# Patient Record
Sex: Female | Born: 1980 | Race: White | Hispanic: No | Marital: Single | Smoking: Never smoker
Health system: Southern US, Community
[De-identification: ages and names within clinical notes are randomized; demographics above are authoritative.]

## PROBLEM LIST (undated history)

## (undated) DIAGNOSIS — F329 Major depressive disorder, single episode, unspecified: Secondary | ICD-10-CM

## (undated) DIAGNOSIS — F32A Depression, unspecified: Secondary | ICD-10-CM

## (undated) DIAGNOSIS — F419 Anxiety disorder, unspecified: Secondary | ICD-10-CM

## (undated) DIAGNOSIS — F319 Bipolar disorder, unspecified: Secondary | ICD-10-CM

## (undated) DIAGNOSIS — F1010 Alcohol abuse, uncomplicated: Secondary | ICD-10-CM

## (undated) HISTORY — PX: ADENOIDECTOMY: SUR15

---
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
## 2008-06-06 ENCOUNTER — Emergency Department (HOSPITAL_BASED_OUTPATIENT_CLINIC_OR_DEPARTMENT_OTHER): Admission: EM | Admit: 2008-06-06 | Discharge: 2008-06-06 | Payer: Self-pay | Admitting: Emergency Medicine

## 2010-03-10 ENCOUNTER — Ambulatory Visit (HOSPITAL_COMMUNITY)
Admission: RE | Admit: 2010-03-10 | Discharge: 2010-03-10 | Payer: Self-pay | Source: Home / Self Care | Admitting: Psychiatry

## 2011-02-11 ENCOUNTER — Ambulatory Visit: Payer: 59 | Admitting: Family Medicine

## 2011-03-29 DIAGNOSIS — J309 Allergic rhinitis, unspecified: Secondary | ICD-10-CM | POA: Insufficient documentation

## 2011-05-19 DIAGNOSIS — G4700 Insomnia, unspecified: Secondary | ICD-10-CM | POA: Insufficient documentation

## 2012-02-23 DIAGNOSIS — Z800 Family history of malignant neoplasm of digestive organs: Secondary | ICD-10-CM | POA: Insufficient documentation

## 2014-02-13 DIAGNOSIS — F1021 Alcohol dependence, in remission: Secondary | ICD-10-CM | POA: Insufficient documentation

## 2014-02-13 DIAGNOSIS — Z8249 Family history of ischemic heart disease and other diseases of the circulatory system: Secondary | ICD-10-CM | POA: Insufficient documentation

## 2014-03-06 DIAGNOSIS — F3132 Bipolar disorder, current episode depressed, moderate: Secondary | ICD-10-CM | POA: Insufficient documentation

## 2014-12-05 DIAGNOSIS — F3281 Premenstrual dysphoric disorder: Secondary | ICD-10-CM | POA: Insufficient documentation

## 2014-12-05 DIAGNOSIS — Z87891 Personal history of nicotine dependence: Secondary | ICD-10-CM | POA: Insufficient documentation

## 2018-08-15 ENCOUNTER — Other Ambulatory Visit: Payer: Self-pay | Admitting: Obstetrics and Gynecology

## 2018-08-15 ENCOUNTER — Other Ambulatory Visit (HOSPITAL_COMMUNITY)
Admission: RE | Admit: 2018-08-15 | Discharge: 2018-08-15 | Disposition: A | Discharge status: Home / Self Care | Payer: BLUE CROSS/BLUE SHIELD | Source: Ambulatory Visit | Attending: Obstetrics and Gynecology | Admitting: Obstetrics and Gynecology

## 2018-08-15 DIAGNOSIS — Z124 Encounter for screening for malignant neoplasm of cervix: Secondary | ICD-10-CM | POA: Insufficient documentation

## 2018-09-05 ENCOUNTER — Other Ambulatory Visit: Payer: Self-pay | Admitting: Obstetrics and Gynecology

## 2018-09-06 LAB — CYTOLOGY - PAP
Diagnosis: NEGATIVE
HPV: NOT DETECTED

## 2018-09-12 ENCOUNTER — Encounter (HOSPITAL_COMMUNITY): Payer: Self-pay | Admitting: Emergency Medicine

## 2018-09-12 ENCOUNTER — Emergency Department (HOSPITAL_COMMUNITY)
Admission: EM | Admit: 2018-09-12 | Discharge: 2018-09-12 | Disposition: A | Discharge status: Home / Self Care | Payer: BLUE CROSS/BLUE SHIELD | Attending: Emergency Medicine | Admitting: Emergency Medicine

## 2018-09-12 ENCOUNTER — Other Ambulatory Visit: Payer: Self-pay

## 2018-09-12 DIAGNOSIS — F419 Anxiety disorder, unspecified: Secondary | ICD-10-CM | POA: Diagnosis not present

## 2018-09-12 DIAGNOSIS — R202 Paresthesia of skin: Secondary | ICD-10-CM | POA: Diagnosis present

## 2018-09-12 DIAGNOSIS — M62838 Other muscle spasm: Secondary | ICD-10-CM | POA: Diagnosis not present

## 2018-09-12 DIAGNOSIS — M5412 Radiculopathy, cervical region: Secondary | ICD-10-CM | POA: Insufficient documentation

## 2018-09-12 HISTORY — DX: Bipolar disorder, unspecified: F31.9

## 2018-09-12 HISTORY — DX: Anxiety disorder, unspecified: F41.9

## 2018-09-12 HISTORY — DX: Alcohol abuse, uncomplicated: F10.10

## 2018-09-12 LAB — COMPREHENSIVE METABOLIC PANEL
ALT: 13 U/L (ref 0–44)
AST: 18 U/L (ref 15–41)
Albumin: 4.6 g/dL (ref 3.5–5.0)
Alkaline Phosphatase: 35 U/L — ABNORMAL LOW (ref 38–126)
Anion gap: 8 (ref 5–15)
BUN: 9 mg/dL (ref 6–20)
CO2: 23 mmol/L (ref 22–32)
Calcium: 9.2 mg/dL (ref 8.9–10.3)
Chloride: 105 mmol/L (ref 98–111)
Creatinine, Ser: 1.03 mg/dL — ABNORMAL HIGH (ref 0.44–1.00)
GFR calc Af Amer: >60 mL/min (ref >60)
GFR calc non Af Amer: >60 mL/min (ref >60)
Glucose, Bld: 95 mg/dL (ref 70–99)
Potassium: 3.5 mmol/L (ref 3.5–5.1)
Sodium: 136 mmol/L (ref 135–145)
Total Bilirubin: 0.8 mg/dL (ref 0.3–1.2)
Total Protein: 7.2 g/dL (ref 6.5–8.1)

## 2018-09-12 LAB — CBC WITH DIFFERENTIAL/PLATELET
Abs Immature Granulocytes: 0.02 10*3/uL (ref 0.00–0.07)
Basophils Absolute: 0 10*3/uL (ref 0.0–0.1)
Basophils Relative: 1 %
Eosinophils Absolute: 0.2 10*3/uL (ref 0.0–0.5)
Eosinophils Relative: 3 %
HCT: 37.2 % (ref 36.0–46.0)
Hemoglobin: 12.2 g/dL (ref 12.0–15.0)
Immature Granulocytes: 0 %
Lymphocytes Relative: 36 %
Lymphs Abs: 2.1 10*3/uL (ref 0.7–4.0)
MCH: 28.8 pg (ref 26.0–34.0)
MCHC: 32.8 g/dL (ref 30.0–36.0)
MCV: 87.9 fL (ref 80.0–100.0)
Monocytes Absolute: 0.4 10*3/uL (ref 0.1–1.0)
Monocytes Relative: 8 %
Neutro Abs: 3.1 10*3/uL (ref 1.7–7.7)
Neutrophils Relative %: 52 %
Platelets: 256 10*3/uL (ref 150–400)
RBC: 4.23 MIL/uL (ref 3.87–5.11)
RDW: 15.2 % (ref 11.5–15.5)
WBC: 5.9 10*3/uL (ref 4.0–10.5)
nRBC: 0 % (ref 0.0–0.2)

## 2018-09-12 LAB — LIPASE, BLOOD: Lipase: 24 U/L (ref 11–51)

## 2018-09-12 LAB — TROPONIN I: Troponin I: <0.03 ng/mL (ref <0.03)

## 2018-09-12 MED ORDER — METHOCARBAMOL 500 MG PO TABS: 1000.0000 mg | ORAL_TABLET | Freq: Three times a day (TID) | ORAL | 0 refills | Status: DC | PRN

## 2018-09-12 MED ORDER — DIAZEPAM 2 MG PO TABS
2.0000 mg | ORAL_TABLET | Freq: Once | ORAL | Status: AC
  Administered 2018-09-12: 21:00:00 2 mg via ORAL
  Filled 2018-09-12: qty 1

## 2018-09-12 MED ORDER — DIAZEPAM 5 MG PO TABS: 5.0000 mg | ORAL_TABLET | Freq: Two times a day (BID) | ORAL | 0 refills | Status: DC | PRN

## 2018-09-12 MED ORDER — METHOCARBAMOL 500 MG PO TABS
1000.0000 mg | ORAL_TABLET | Freq: Once | ORAL | Status: AC
  Administered 2018-09-12: 21:00:00 1000 mg via ORAL
  Filled 2018-09-12: qty 2

## 2018-09-12 MED ORDER — KETOROLAC TROMETHAMINE 60 MG/2ML IM SOLN
60.0000 mg | Freq: Once | INTRAMUSCULAR | Status: AC
  Administered 2018-09-12: 21:00:00 60 mg via INTRAMUSCULAR
  Filled 2018-09-12: qty 2

## 2018-09-12 MED ORDER — IBUPROFEN 600 MG PO TABS: 600.0000 mg | ORAL_TABLET | Freq: Four times a day (QID) | ORAL | 0 refills | Status: DC | PRN

## 2018-09-12 NOTE — ED Provider Notes (Signed)
[facility]-EMERGENCY DEPT Provider Note   CSN: [id number] Arrival date & time: 09/12/18  2007    History   Chief Complaint Chief Complaint  Patient presents with  . Tingling    HPI Renee Roach is a 38 y.o. female.     HPI Patient with history of bipolar 1 disorder and anxiety presents with right-sided neck pain that radiates down her right arm.  Associated with tingling sensation to her right hand.  States she noticed this this morning when she woke up.  No focal weakness.  No known trauma.  No fever or chills. Past Medical History:  Diagnosis Date  . Alcohol abuse   . Anxiety   . Bipolar 1 disorder (HCC)     There are no active problems to display for this patient.   History reviewed. No pertinent surgical history.   OB History   No obstetric history on file.      Home Medications    Prior to Admission medications   Medication Sig Start Date End Date Taking? Authorizing Provider  diazepam (VALIUM) 5 MG tablet Take 1 tablet (5 mg total) by mouth every 12 (twelve) hours as needed for muscle spasms. 09/12/18   Loren RacerYelverton, Kaeden Mester, MD  ibuprofen (ADVIL) 600 MG tablet Take 1 tablet (600 mg total) by mouth every 6 (six) hours as needed. 09/12/18   Loren RacerYelverton, Ansley Mangiapane, MD  methocarbamol (ROBAXIN) 500 MG tablet Take 2 tablets (1,000 mg total) by mouth every 8 (eight) hours as needed for muscle spasms. 09/12/18   Loren RacerYelverton, Arabia Nylund, MD    Family History No family history on file.  Social History Social History   Tobacco Use  . Smoking status: Not on file  Substance Use Topics  . Alcohol use: Not on file  . Drug use: Not on file     Allergies   Sulfa antibiotics   Review of Systems Review of Systems  Constitutional: Negative for chills and fever.  HENT: Negative for sore throat and trouble swallowing.   Eyes: Positive for photophobia. Negative for visual disturbance.  Respiratory: Negative for cough and shortness of breath.    Cardiovascular: Negative for chest pain.  Gastrointestinal: Positive for nausea. Negative for abdominal pain, diarrhea and vomiting.  Musculoskeletal: Positive for back pain, myalgias and neck pain. Negative for neck stiffness.  Skin: Negative for rash and wound.  Neurological: Positive for numbness and headaches. Negative for weakness.  Psychiatric/Behavioral: The patient is nervous/anxious.   All other systems reviewed and are negative.    Physical Exam Updated Vital Signs BP 119/81 (BP Location: Left Arm)   Pulse 71   Temp 98.9 F (37.2 C) (Oral)   Resp 13   SpO2 99%   Physical Exam Vitals signs and nursing note reviewed.  Constitutional:      Appearance: Normal appearance. She is well-developed.     Comments: Very anxious appearing  HENT:     Head: Normocephalic and atraumatic.     Comments: Cranial nerves II through XII intact.    Nose: Nose normal.     Mouth/Throat:     Mouth: Mucous membranes are moist.  Eyes:     Extraocular Movements: Extraocular movements intact.     Pupils: Pupils are equal, round, and reactive to light.  Neck:     Musculoskeletal: Normal range of motion and neck supple. Muscular tenderness present. No neck rigidity.     Comments: No meningismus.  No cervical lymphadenopathy.  No midline cervical tenderness to palpation.  Patient does  have right-sided paracervical muscular tenderness as well as right trapezius tenderness and spasm. Cardiovascular:     Rate and Rhythm: Normal rate and regular rhythm.     Heart sounds: No murmur. No friction rub. No gallop.   Pulmonary:     Effort: Pulmonary effort is normal. No respiratory distress.     Breath sounds: Normal breath sounds. No stridor. No wheezing, rhonchi or rales.  Chest:     Chest wall: No tenderness.  Abdominal:     General: Bowel sounds are normal.     Palpations: Abdomen is soft.     Tenderness: There is no abdominal tenderness. There is no guarding or rebound.  Musculoskeletal: Normal  range of motion.        General: No swelling, tenderness, deformity or signs of injury.     Right lower leg: No edema.     Left lower leg: No edema.     Comments: Full range of motion of the right shoulder elbow and wrist without deformity, effusion or erythema.  2+ distal pulses all extremities.  No swelling noted.  No midline thoracic or lumbar tenderness.  No lower extremity swelling, asymmetry or tenderness.  Lymphadenopathy:     Cervical: No cervical adenopathy.  Skin:    General: Skin is warm and dry.     Findings: No erythema or rash.  Neurological:     General: No focal deficit present.     Mental Status: She is alert and oriented to person, place, and time.     Comments: 5/5 motor in all extremities.  Decreased sensation in the right hand compared to the left.  Otherwise sensation intact.  Psychiatric:        Behavior: Behavior normal.      ED Treatments / Results  Labs (all labs ordered are listed, but only abnormal results are displayed) Labs Reviewed  COMPREHENSIVE METABOLIC PANEL - Abnormal; Notable for the following components:      Result Value   Creatinine, Ser 1.03 (*)    Alkaline Phosphatase 35 (*)    All other components within normal limits  CBC WITH DIFFERENTIAL/PLATELET  TROPONIN I  LIPASE, BLOOD    EKG EKG Interpretation  Date/Time:  Wednesday Sep 12 2018 20:40:46 EDT Ventricular Rate:  74 PR Interval:    QRS Duration: 80 QT Interval:  380 QTC Calculation: 422 R Axis:   80 Text Interpretation:  Sinus rhythm Baseline wander in lead(s) V3 Confirmed by Loren Racer ([id number]) on 09/12/2018 9:06:02 PM   Radiology No results found.  Procedures Procedures (including critical care time)  Medications Ordered in ED Medications  ketorolac (TORADOL) injection 60 mg (60 mg Intramuscular Given 09/12/18 2115)  methocarbamol (ROBAXIN) tablet 1,000 mg (1,000 mg Oral Given 09/12/18 2114)  diazepam (VALIUM) tablet 2 mg (2 mg Oral Given 09/12/18 2114)      Initial Impression / Assessment and Plan / ED Course  I have reviewed the triage vital signs and the nursing notes.  Pertinent labs & imaging results that were available during my care of the patient were reviewed by me and considered in my medical decision making (see chart for details).        Patient presents with radicular type symptoms.  Also appears to be quite anxious which is complicating clinical picture.  Low suspicion for CAD or CVA.  Will treat symptomatically and screen with labs. Patient states she is feeling better after meds.  Vital signs remained stable.  Laboratory work within normal limits.  Return  precautions given. Final Clinical Impressions(s) / ED Diagnoses   Final diagnoses:  Cervical radiculopathy  Trapezius muscle spasm  Anxiety    ED Discharge Orders         Ordered    ibuprofen (ADVIL) 600 MG tablet  Every 6 hours PRN     09/12/18 2231    methocarbamol (ROBAXIN) 500 MG tablet  Every 8 hours PRN     09/12/18 2231    diazepam (VALIUM) 5 MG tablet  Every 12 hours PRN     09/12/18 2231           Loren Racer, MD 09/12/18 2235

## 2018-09-12 NOTE — ED Triage Notes (Signed)
Patient reports pain from right neck to right hand. States tinging in right hand and feeling "confused." Denies N/V/D, cough, fever. A&Ox4. Also reports central chest pain today but denies at this time. Ambulatory.

## 2018-09-12 NOTE — ED Notes (Signed)
Pt verbalized discharge instructions and follow up care. Alert and ambulatory. No iv.  

## 2018-10-24 ENCOUNTER — Other Ambulatory Visit: Payer: Self-pay

## 2018-10-24 ENCOUNTER — Encounter (HOSPITAL_BASED_OUTPATIENT_CLINIC_OR_DEPARTMENT_OTHER): Payer: Self-pay | Admitting: *Deleted

## 2018-10-27 ENCOUNTER — Other Ambulatory Visit (HOSPITAL_COMMUNITY)
Admission: RE | Admit: 2018-10-27 | Discharge: 2018-10-27 | Disposition: A | Discharge status: Home / Self Care | Payer: BC Managed Care – PPO | Source: Ambulatory Visit | Attending: Obstetrics and Gynecology | Admitting: Obstetrics and Gynecology

## 2018-10-27 DIAGNOSIS — Z1159 Encounter for screening for other viral diseases: Secondary | ICD-10-CM | POA: Diagnosis not present

## 2018-10-27 DIAGNOSIS — Z01812 Encounter for preprocedural laboratory examination: Secondary | ICD-10-CM | POA: Insufficient documentation

## 2018-10-27 LAB — SARS CORONAVIRUS 2 (TAT 6-24 HRS): SARS Coronavirus 2: NEGATIVE

## 2018-10-29 ENCOUNTER — Encounter (HOSPITAL_BASED_OUTPATIENT_CLINIC_OR_DEPARTMENT_OTHER)
Admission: RE | Admit: 2018-10-29 | Discharge: 2018-10-29 | Disposition: A | Discharge status: Home / Self Care | Payer: BC Managed Care – PPO | Source: Ambulatory Visit | Attending: Obstetrics and Gynecology | Admitting: Obstetrics and Gynecology

## 2018-10-29 ENCOUNTER — Other Ambulatory Visit: Payer: Self-pay

## 2018-10-29 DIAGNOSIS — Z01812 Encounter for preprocedural laboratory examination: Secondary | ICD-10-CM | POA: Diagnosis present

## 2018-10-29 DIAGNOSIS — N9489 Other specified conditions associated with female genital organs and menstrual cycle: Secondary | ICD-10-CM | POA: Diagnosis not present

## 2018-10-29 LAB — CBC
HCT: 39.9 % (ref 36.0–46.0)
Hemoglobin: 12.8 g/dL (ref 12.0–15.0)
MCH: 28.8 pg (ref 26.0–34.0)
MCHC: 32.1 g/dL (ref 30.0–36.0)
MCV: 89.7 fL (ref 80.0–100.0)
Platelets: 263 10*3/uL (ref 150–400)
RBC: 4.45 MIL/uL (ref 3.87–5.11)
RDW: 15.1 % (ref 11.5–15.5)
WBC: 7.5 10*3/uL (ref 4.0–10.5)
nRBC: 0 % (ref 0.0–0.2)

## 2018-10-29 LAB — TYPE AND SCREEN
ABO/RH(D): O POS
Antibody Screen: NEGATIVE

## 2018-10-29 LAB — POCT PREGNANCY, URINE: Preg Test, Ur: NEGATIVE

## 2018-10-29 NOTE — Progress Notes (Signed)
Ensure pre surgery drink given with instructions to complete by 0830 dos, pt verbalized understanding. 

## 2018-10-30 LAB — ABO/RH: ABO/RH(D): O POS

## 2018-10-31 ENCOUNTER — Encounter (HOSPITAL_BASED_OUTPATIENT_CLINIC_OR_DEPARTMENT_OTHER): Payer: Self-pay | Admitting: Certified Registered Nurse Anesthetist

## 2018-10-31 ENCOUNTER — Ambulatory Visit (HOSPITAL_BASED_OUTPATIENT_CLINIC_OR_DEPARTMENT_OTHER): Payer: BC Managed Care – PPO | Admitting: Certified Registered Nurse Anesthetist

## 2018-10-31 ENCOUNTER — Ambulatory Visit (HOSPITAL_BASED_OUTPATIENT_CLINIC_OR_DEPARTMENT_OTHER)
Admission: RE | Admit: 2018-10-31 | Discharge: 2018-10-31 | Disposition: A | Discharge status: Home / Self Care | Payer: BC Managed Care – PPO | Attending: Obstetrics and Gynecology | Admitting: Obstetrics and Gynecology

## 2018-10-31 ENCOUNTER — Other Ambulatory Visit: Payer: Self-pay

## 2018-10-31 ENCOUNTER — Encounter (HOSPITAL_BASED_OUTPATIENT_CLINIC_OR_DEPARTMENT_OTHER)
Admission: RE | Disposition: A | Discharge status: Home / Self Care | Payer: Self-pay | Source: Home / Self Care | Attending: Obstetrics and Gynecology

## 2018-10-31 DIAGNOSIS — F3281 Premenstrual dysphoric disorder: Secondary | ICD-10-CM | POA: Diagnosis not present

## 2018-10-31 DIAGNOSIS — N840 Polyp of corpus uteri: Secondary | ICD-10-CM | POA: Insufficient documentation

## 2018-10-31 DIAGNOSIS — F419 Anxiety disorder, unspecified: Secondary | ICD-10-CM | POA: Insufficient documentation

## 2018-10-31 DIAGNOSIS — Z79899 Other long term (current) drug therapy: Secondary | ICD-10-CM | POA: Insufficient documentation

## 2018-10-31 DIAGNOSIS — N920 Excessive and frequent menstruation with regular cycle: Secondary | ICD-10-CM | POA: Diagnosis not present

## 2018-10-31 HISTORY — DX: Depression, unspecified: F32.A

## 2018-10-31 HISTORY — PX: DILATATION & CURETTAGE/HYSTEROSCOPY WITH MYOSURE: SHX6511

## 2018-10-31 SURGERY — DILATATION & CURETTAGE/HYSTEROSCOPY WITH MYOSURE
Anesthesia: General | Site: Vagina

## 2018-10-31 MED ORDER — SILVER NITRATE-POT NITRATE 75-25 % EX MISC
CUTANEOUS | Status: AC
  Filled 2018-10-31: qty 1

## 2018-10-31 MED ORDER — LIDOCAINE 2% (20 MG/ML) 5 ML SYRINGE
INTRAMUSCULAR | Status: AC
  Filled 2018-10-31: qty 5

## 2018-10-31 MED ORDER — MIDAZOLAM HCL 5 MG/5ML IJ SOLN
INTRAMUSCULAR | Status: DC | PRN
  Administered 2018-10-31: 2 mg via INTRAVENOUS

## 2018-10-31 MED ORDER — FENTANYL CITRATE (PF) 100 MCG/2ML IJ SOLN
25.0000 ug | INTRAMUSCULAR | Status: DC | PRN
  Administered 2018-10-31: 25 ug via INTRAVENOUS
  Administered 2018-10-31: 50 ug via INTRAVENOUS

## 2018-10-31 MED ORDER — MIDAZOLAM HCL 2 MG/2ML IJ SOLN: 1.0000 mg | INTRAMUSCULAR | Status: DC | PRN

## 2018-10-31 MED ORDER — ONDANSETRON HCL 4 MG/2ML IJ SOLN: 4.0000 mg | Freq: Once | INTRAMUSCULAR | Status: DC | PRN

## 2018-10-31 MED ORDER — FENTANYL CITRATE (PF) 100 MCG/2ML IJ SOLN
INTRAMUSCULAR | Status: AC
  Filled 2018-10-31: qty 2

## 2018-10-31 MED ORDER — MIDAZOLAM HCL 2 MG/2ML IJ SOLN
INTRAMUSCULAR | Status: AC
  Filled 2018-10-31: qty 2

## 2018-10-31 MED ORDER — LACTATED RINGERS IV SOLN
INTRAVENOUS | Status: DC
  Administered 2018-10-31: 12:00:00 via INTRAVENOUS

## 2018-10-31 MED ORDER — SCOPOLAMINE 1 MG/3DAYS TD PT72: 1.0000 | MEDICATED_PATCH | Freq: Once | TRANSDERMAL | Status: DC

## 2018-10-31 MED ORDER — VASOPRESSIN 20 UNIT/ML IV SOLN
INTRAVENOUS | Status: AC
  Filled 2018-10-31: qty 1

## 2018-10-31 MED ORDER — LIDOCAINE 2% (20 MG/ML) 5 ML SYRINGE
INTRAMUSCULAR | Status: DC | PRN
  Administered 2018-10-31: 60 mg via INTRAVENOUS

## 2018-10-31 MED ORDER — ONDANSETRON HCL 4 MG/2ML IJ SOLN
INTRAMUSCULAR | Status: DC | PRN
  Administered 2018-10-31: 4 mg via INTRAVENOUS

## 2018-10-31 MED ORDER — LIDOCAINE HCL 2 % IJ SOLN
INTRAMUSCULAR | Status: AC
  Filled 2018-10-31: qty 20

## 2018-10-31 MED ORDER — OXYCODONE HCL 5 MG/5ML PO SOLN: 5.0000 mg | Freq: Once | ORAL | Status: DC | PRN

## 2018-10-31 MED ORDER — PHENYLEPHRINE 40 MCG/ML (10ML) SYRINGE FOR IV PUSH (FOR BLOOD PRESSURE SUPPORT)
PREFILLED_SYRINGE | INTRAVENOUS | Status: AC
  Filled 2018-10-31: qty 10

## 2018-10-31 MED ORDER — DEXAMETHASONE SODIUM PHOSPHATE 10 MG/ML IJ SOLN
INTRAMUSCULAR | Status: AC
  Filled 2018-10-31: qty 1

## 2018-10-31 MED ORDER — LIDOCAINE HCL 2 % IJ SOLN
INTRAMUSCULAR | Status: DC | PRN
  Administered 2018-10-31: 10 mL

## 2018-10-31 MED ORDER — PHENYLEPHRINE 40 MCG/ML (10ML) SYRINGE FOR IV PUSH (FOR BLOOD PRESSURE SUPPORT)
PREFILLED_SYRINGE | INTRAVENOUS | Status: DC | PRN
  Administered 2018-10-31 (×2): 80 ug via INTRAVENOUS

## 2018-10-31 MED ORDER — DEXMEDETOMIDINE HCL IN NACL 200 MCG/50ML IV SOLN
INTRAVENOUS | Status: DC | PRN
  Administered 2018-10-31 (×2): 8 ug via INTRAVENOUS

## 2018-10-31 MED ORDER — IBUPROFEN 600 MG PO TABS: 600.0000 mg | ORAL_TABLET | Freq: Four times a day (QID) | ORAL | 0 refills | Status: DC | PRN

## 2018-10-31 MED ORDER — PROPOFOL 10 MG/ML IV BOLUS
INTRAVENOUS | Status: DC | PRN
  Administered 2018-10-31: 150 mg via INTRAVENOUS

## 2018-10-31 MED ORDER — OXYCODONE HCL 5 MG PO TABS: 5.0000 mg | ORAL_TABLET | Freq: Once | ORAL | Status: DC | PRN

## 2018-10-31 MED ORDER — DEXAMETHASONE SODIUM PHOSPHATE 10 MG/ML IJ SOLN
INTRAMUSCULAR | Status: DC | PRN
  Administered 2018-10-31: 10 mg via INTRAVENOUS

## 2018-10-31 MED ORDER — ONDANSETRON HCL 4 MG/2ML IJ SOLN
INTRAMUSCULAR | Status: AC
  Filled 2018-10-31: qty 2

## 2018-10-31 MED ORDER — FENTANYL CITRATE (PF) 100 MCG/2ML IJ SOLN: 50.0000 ug | INTRAMUSCULAR | Status: DC | PRN

## 2018-10-31 MED ORDER — FENTANYL CITRATE (PF) 100 MCG/2ML IJ SOLN
INTRAMUSCULAR | Status: DC | PRN
  Administered 2018-10-31 (×4): 25 ug via INTRAVENOUS
  Administered 2018-10-31: 100 ug via INTRAVENOUS

## 2018-10-31 SURGICAL SUPPLY — 26 items
BRIEF STRETCH FOR OB PAD XXL (UNDERPADS AND DIAPERS) ×3 IMPLANT
CANISTER SUCT 3000ML PPV (MISCELLANEOUS) ×3 IMPLANT
CATH ROBINSON RED A/P 14FR (CATHETERS) ×3 IMPLANT
CLOTH BEACON ORANGE TIMEOUT ST (SAFETY) ×3 IMPLANT
DEVICE MYOSURE LITE (MISCELLANEOUS) IMPLANT
DEVICE MYOSURE REACH (MISCELLANEOUS) ×3 IMPLANT
DILATOR CANAL MILEX (MISCELLANEOUS) ×3 IMPLANT
GLOVE BIO SURGEON STRL SZ7 (GLOVE) ×3 IMPLANT
GLOVE BIOGEL PI IND STRL 7.0 (GLOVE) ×3 IMPLANT
GLOVE BIOGEL PI INDICATOR 7.0 (GLOVE) ×6
GLOVE SURG SS PI 7.0 STRL IVOR (GLOVE) ×3 IMPLANT
GLOVE SURG SYN 7.5  E (GLOVE) ×2
GLOVE SURG SYN 7.5 E (GLOVE) ×1 IMPLANT
GOWN STRL REUS W/TWL LRG LVL3 (GOWN DISPOSABLE) ×6 IMPLANT
IV NS IRRIG 3000ML ARTHROMATIC (IV SOLUTION) ×3 IMPLANT
KIT PROCEDURE FLUENT (KITS) ×3 IMPLANT
MYOSURE XL FIBROID (MISCELLANEOUS)
PACK VAGINAL MINOR WOMEN LF (CUSTOM PROCEDURE TRAY) ×3 IMPLANT
PAD OB MATERNITY 4.3X12.25 (PERSONAL CARE ITEMS) ×3 IMPLANT
PAD PREP 24X48 CUFFED NSTRL (MISCELLANEOUS) ×3 IMPLANT
SEAL ROD LENS SCOPE MYOSURE (ABLATOR) ×3 IMPLANT
SLEEVE SCD COMPRESS KNEE MED (MISCELLANEOUS) ×3 IMPLANT
SURGILUBE 2OZ TUBE FLIPTOP (MISCELLANEOUS) IMPLANT
SYSTEM TISS REMOVAL MYOSURE XL (MISCELLANEOUS) IMPLANT
TOWEL GREEN STERILE FF (TOWEL DISPOSABLE) ×6 IMPLANT
WATER STERILE IRR 1000ML POUR (IV SOLUTION) IMPLANT

## 2018-10-31 NOTE — Brief Op Note (Signed)
10/31/2018  1:30 PM  PATIENT:  Renee Roach  38 y.o. female  PRE-OPERATIVE DIAGNOSIS: Menorrhagia,  N94.89 endometrial mass  POST-OPERATIVE DIAGNOSIS: Same, endometrial polyps  PROCEDURE:  Procedure(s): DILATATION & CURETTAGE/HYSTEROSCOPY WITH MYOSURE (N/A)  SURGEON:  Surgeon(s) and Role:    Thurnell Lose, MD - Primary  PHYSICIAN ASSISTANT:   ASSISTANTS: Technician   ANESTHESIA:   general and paracervical block  EBL:  5 mL   BLOOD ADMINISTERED:none  DRAINS: none   LOCAL MEDICATIONS USED:  LIDOCAINE  and Amount: 10 ml  SPECIMEN:  Source of Specimen:  endometrial polyps in sock, endometrial currettings  DISPOSITION OF SPECIMEN:  PATHOLOGY  COUNTS:  YES  TOURNIQUET:  * No tourniquets in log *  DICTATION: .Other Dictation: Dictation Number [id number]  PLAN OF CARE: Discharge to home after PACU  PATIENT DISPOSITION:  PACU - hemodynamically stable.   Delay start of Pharmacological VTE agent (>24hrs) due to surgical blood loss or risk of bleeding: yes

## 2018-10-31 NOTE — Interval H&P Note (Signed)
History and Physical Interval Note:  10/31/2018 12:23 PM  Renee Roach  has presented today for surgery, with the diagnosis of N94.89 endometrial mass.  The various methods of treatment have been discussed with the patient and family. After consideration of risks, benefits and other options for treatment, the patient has consented to  Procedure(s) with comments: DILATATION & CURETTAGE/HYSTEROSCOPY WITH MYOSURE (N/A) - myosure rep will be here.  Confirmed on 10/24/2018 CS as a surgical intervention.  The patient's history has been reviewed, patient examined, no change in status, stable for surgery.  I have reviewed the patient's chart and labs.  Questions were answered to the patient's satisfaction.     Thurnell Lose

## 2018-10-31 NOTE — Anesthesia Postprocedure Evaluation (Signed)
Anesthesia Post Note  Patient: Production designer, theatre/television/film  Procedure(s) Performed: DILATATION & CURETTAGE/HYSTEROSCOPY WITH MYOSURE (N/A Vagina )     Patient location during evaluation: PACU Anesthesia Type: General Level of consciousness: awake and alert Pain management: pain level controlled Vital Signs Assessment: post-procedure vital signs reviewed and stable Respiratory status: spontaneous breathing, nonlabored ventilation and respiratory function stable Cardiovascular status: blood pressure returned to baseline and stable Postop Assessment: no apparent nausea or vomiting Anesthetic complications: no    Last Vitals:  Vitals:   10/31/18 1430 10/31/18 1515  BP: 104/75 116/90  Pulse: 75 75  Resp: 17 16  Temp:  37.1 C  SpO2: 98% 100%    Last Pain:  Vitals:   10/31/18 1515  TempSrc:   PainSc: 3                  Lidia Collum

## 2018-10-31 NOTE — Transfer of Care (Signed)
Immediate Anesthesia Transfer of Care Note  Patient: Renee Roach  Procedure(s) Performed: DILATATION & CURETTAGE/HYSTEROSCOPY WITH MYOSURE (N/A Vagina )  Patient Location: PACU  Anesthesia Type:General  Level of Consciousness: drowsy and patient cooperative  Airway & Oxygen Therapy: Patient Spontanous Breathing and Patient connected to face mask oxygen  Post-op Assessment: Report given to RN and Post -op Vital signs reviewed and stable  Post vital signs: Reviewed and stable  Last Vitals:  Vitals Value Taken Time  BP 91/57 10/31/18 1323  Temp    Pulse 73 10/31/18 1327  Resp 14 10/31/18 1327  SpO2 100 % 10/31/18 1327  Vitals shown include unvalidated device data.  Last Pain:  Vitals:   10/31/18 1144  TempSrc: Oral  PainSc: 0-No pain      Patients Stated Pain Goal: 0 (38/10/17 5102)  Complications: No apparent anesthesia complications

## 2018-10-31 NOTE — Anesthesia Preprocedure Evaluation (Addendum)
Anesthesia Evaluation  Patient identified by MRN, date of birth, ID band Patient awake    Reviewed: Allergy & Precautions, NPO status , Patient's Chart, lab work & pertinent test results  History of Anesthesia Complications Negative for: history of anesthetic complications  Airway Mallampati: I  TM Distance: >3 FB Neck ROM: Full    Dental  (+) Teeth Intact   Pulmonary neg pulmonary ROS,    Pulmonary exam normal        Cardiovascular negative cardio ROS Normal cardiovascular exam     Neuro/Psych PSYCHIATRIC DISORDERS Anxiety Depression Bipolar Disorder negative neurological ROS     GI/Hepatic negative GI ROS, (+)     substance abuse  alcohol use,   Endo/Other  negative endocrine ROS  Renal/GU negative Renal ROS  negative genitourinary   Musculoskeletal negative musculoskeletal ROS (+)   Abdominal   Peds  Hematology negative hematology ROS (+)   Anesthesia Other Findings   Reproductive/Obstetrics                            Anesthesia Physical Anesthesia Plan  ASA: II  Anesthesia Plan: General   Post-op Pain Management:    Induction: Intravenous  PONV Risk Score and Plan: 3 and Ondansetron, Dexamethasone, Midazolam and Treatment may vary due to age or medical condition  Airway Management Planned: LMA  Additional Equipment: None  Intra-op Plan:   Post-operative Plan: Extubation in OR  Informed Consent: I have reviewed the patients History and Physical, chart, labs and discussed the procedure including the risks, benefits and alternatives for the proposed anesthesia with the patient or authorized representative who has indicated his/her understanding and acceptance.     Dental advisory given  Plan Discussed with:   Anesthesia Plan Comments:        Anesthesia Quick Evaluation

## 2018-10-31 NOTE — Op Note (Signed)
Renee Roach, DAUPHINEE MEDICAL RECORD [id number] ACCOUNT [id number] DATE OF BIRTH:11-Jun-1980 FACILITY: [facility] LOCATION: [facility] PHYSICIAN:Ski Polich Al Decant, MD  OPERATIVE REPORT  DATE OF PROCEDURE:  10/31/2018  PREOPERATIVE DIAGNOSES:  Menorrhagia and endometrial mass.  POSTOPERATIVE DIAGNOSES:  Menorrhagia and endometrial mass, and endometrial polyps.  PROCEDURE:  Hysteroscopy, dilatation and curettage with endometrial polypectomy via MyoSure.  SURGEON:  Thurnell Lose, MD  ASSISTANT:  Technician.  ANESTHESIA:  General and paracervical block.  ESTIMATED BLOOD LOSS:  5 mL.  DRAINS:  None.  LOCAL:  Lidocaine 2%, 10 mL.  SPECIMENS:  Endometrial polyps in sac and endometrial curettings.  DISPOSITION OF SPECIMEN:  To Pathology.  COUNTS CORRECT:  Times 3.  DISPOSITION:  To PACU hemodynamically stable.  COMPLICATIONS:  None.  FINDINGS:  Normal shape of the uterine cavity.  Polypoid endometrium and endometrial polyps visualized.  Normal cervix and vagina.  DESCRIPTION OF PROCEDURE:  The patient was identified in the holding area.  She was then taken to the operating room with IV running.  She was placed in the dorsal lithotomy position, underwent anesthesia without complication.  She was then prepped and  draped in a normal fashion.  SCDs were on her legs and operating.  A timeout was performed.  A Graves speculum was inserted into the vagina.  Cervix was visualized.  Anterior lip of the cervix was injected with 2% lidocaine.  Single tooth tenaculum was then applied.  Paracervical block was completed to complete 10 mL of lidocaine.  The cervix  was then dilated with an os finder and dilated up to a 15 Pratt.  I attempted to advance the hysteroscope at lower doses and it was unable to do so.  I attempted to advance the hysteroscope before I dilated up to a 40 Pratt but was unable to get through  the internal os.  Once I was in the endometrial cavity, the findings  above were noted.  The Reach MyoSure was then used and the polypoid and polyps were removed without issue.  The pressure was at 100.  The deficit was 65 mL.  The MyoSure and hysteroscope were then  removed.  I did a sharp curettage of all 4 quadrants until a gritty cry was noted in the uterine cavity.  There was a moderate amount of tissue returned.  A single-tooth tenaculum was removed.  There was no bleeding from the tenaculum site or the cervical os.  All instruments, sponge and needle counts were correct x3 and the patient tolerated the procedure well.    She went to the recovery room in stable condition.  AN/NUANCE  D:10/31/2018 T:10/31/2018 JOB:[id number]

## 2018-10-31 NOTE — H&P (Signed)
History of Present Illness  Isolation Precautions:          Respiratory Illness Screening             1. Is fever present / reported?  No           2. Are respiratory illness symptom(s) present / reported?  No           3. Are other symptom(s) present / reported?  No           5. Has there been reported travel to a High Risk respiratory illness region?  No           6. Has close* contact with person(s) known to have communicable illness been reported?  No           7. Did travel or close contact (if applicable) occur within 14 days of symptom onset?  No General:          38 y/o female presents for preoperative clearance for Hysteroscopy/ D&C and removal of endometrial mass via MyoSure         Denies fever/chills, chest pain, SOB, headaches, numbness/tingling. No h/o complication with anesthesia, bleeding disorders or blood clots         Pt reports fishy vaginal odor.    Current Medications  TakingLysteda 650 MG Tablet 2 tablets Orally Three times daily x up to day 5 with menses    Clonazepam 0.5 MG Tablet 1/2 - 1 tablet at bedtime by mouth as needed    Tranexamic Acid 650 MG Tablet TAKE 2 TABLETS THREE TIMES DAILY X UP TO DAY 5 WITH MENSES ORALLY 30 DAYS Oral    Fusion Plus - Capsule 1 capsule between meals Orally Once a day    Trazodone HCl 50 MG Tablet 1 tablet at bedtime as needed Orally Once a day    Lamotrigine 25 MG Tablet 2 tablets Orally Once a day    BuPROPion HCl ER (SR) 150 MG Tablet Extended Release 12 Hour 1 tablet Orally twice a day    Medication List reviewed and reconciled with the patient        Past Medical History       Bipolar.        Insomnia.        PMDD with anxiety.        Miscarriage (2002).        History of alcoholism.            Surgical History  T&A 1990  oral surgery       Family History  Father: alive, hypertension, cholesterol, MI (45), colon cancer  Mother: alive, hypertension  Paternal Grand Father: deceased  Paternal Grand  Mother: deceased  Maternal Grand Father: deceased, diagnosed with Breast cancer  Maternal Grand Mother: deceased, ovarian cancer, Ovarian cancer  Mother had lump removed.      Social History  General:         Alcohol: rare: beer.        Children: none.        Seat belt use: yes.        Tobacco use               cigarettes:  Former smoker             Quit in year  11/06/2013             Tobacco history last updated  10/24/2018  Vaping  No       Religion: Ep[employer].        DENTAL CARE: regular.        Marital Status: single.        Recreational drug use: yes, ocass marijuanna.        OCCUPATION: journalist.        Exercise: 1-2 times per week.        no Travel outside [place].     Gyn History  Sexual activity currently sexually active.   Periods : irregular.   LMP 10-08-2018.   Birth control condoms.   Last pap smear date 08-15-2018 Neg/HPV neg.   Last mammogram date N/A.   Abnormal pap smear no.   STD none.   Menarche 54.      OB History  abortion  2003.      Allergies  Sulfamethoxazole: nasusea/ Vomiting/ diarrhea/ headaches      Hospitalization/Major Diagnostic Procedure  Numbness in face 09/12/2018      Review of Systems  See scanned ROS form for details.    Vital Signs  Wt 116.8, Wt change -6.2 lb, Ht 60.75, BMI 22.25, Temp 97.4, Pulse sitting 97, BP sitting 105/70.    Physical Examination  Chaperone present:          Chaperone present  for pelvic exam, Renee Roach,Renee Roach 10/24/2018 04:20:09 PM > .   GENERAL:          Patient appears  alert and oriented.          General Appearance:  well-appearing, well-developed, no acute distress.          Speech:  clear.   LUNGS:          Auscultation:  no wheezing/rhonchi/rales. CTA bilaterally.   HEART:          Heart sounds:  normal. RRR. no murmur.   ABDOMEN:          General:  soft nontender, nondistended, no masses.   FEMALE GENITOURINARY:          Pelvic  Not examined.          Vagina:  pink/moist  mucosa , yellow , frothy discharge, , no lesions.          Vulva:  normal , no lesions , no skin discoloration.   EXTREMITIES:          General:  No edema or calf tenderness.       Pt aware of scribe services today.    Assessments    1. Encounter for other preprocedural examination - Z01.818 (Primary)  2. Endometrial mass - N94.89  3. Vaginal discharge - N89.8      Treatment  1. Encounter for other preprocedural examination   Notes: Pt counseled on R/Roach/A of Hysteroscopy/D&C, including but not limited to infection, bleeding and injury to organs in the abdomen. Pt informed she will have to have a pregnancy test with negative results prior to surgery. Pt instructed not to take Ibuprofen 5 days prior to procedure and no Aspirin 7 days prior to procedure. All questions answered. Posteoperative follow up 2 weeks after surgery.      2. Endometrial mass   Notes: Pt counseled on R/Roach/A of procedure, including but not limited to infection, bleeding and injury to organs in the abdomen. Pt informed she will have to have a pregnancy test with negative results prior to surgery. Pt instructed not to take Ibuprofen 5 days prior to procedure and no Aspirin 7 days prior  to procedure.      3. Vaginal discharge        [facility]: [facility]      Procedures  Scribe Documentation:          Attestation:  I personally scribed for Dr. Dion Roach on the date of this appointment. Electronically signed by scribe , Renee Roach, Renee Roach 10/24/2018 04:11:01 PM > .            Labs        Lab: [facility]   +yeast       WBCS Slightly increased A Normal -        CLUE CELLS None seen  None seen -        TRICHOMONAS None Seen  None Seen -        YEAST Rare A None seen -                 Renee Roach 10/25/2018 08:27:26 AM > Yeast present. Send in Diflucan 150 mg po once #1 Ref:1 Renee Roach,Renee Roach 10/25/2018 09:12:54 AM > spoke with pt-made her aware of results. Rx escribed.                Visit Codes  [id number] OV LEVEL 3.        Follow Up  2 Weeks (Reason: post op)    Care Plan Details

## 2018-10-31 NOTE — Anesthesia Procedure Notes (Signed)
Procedure Name: LMA Insertion Date/Time: 10/31/2018 12:39 PM Performed by: Maryella Shivers, CRNA Pre-anesthesia Checklist: Patient identified, Emergency Drugs available, Suction available and Patient being monitored Patient Re-evaluated:Patient Re-evaluated prior to induction Oxygen Delivery Method: Circle system utilized Preoxygenation: Pre-oxygenation with 100% oxygen Induction Type: IV induction Ventilation: Mask ventilation without difficulty LMA: LMA inserted LMA Size: 4.0 Number of attempts: 1 Airway Equipment and Method: Bite block Placement Confirmation: positive ETCO2 Tube secured with: Tape Dental Injury: Teeth and Oropharynx as per pre-operative assessment

## 2018-10-31 NOTE — Discharge Instructions (Addendum)
Dilation and Curettage or Vacuum Curettage, Care After °This sheet gives you information about how to care for yourself after your procedure. Your health care provider may also give you more specific instructions. If you have problems or questions, contact your health care provider. °What can I expect after the procedure? °After your procedure, it is common to have: °· Mild pain or cramping. °· Some vaginal bleeding or spotting. °These may last for up to 2 weeks after your procedure. °Follow these instructions at home: °Activity ° °· Do not drive or use heavy machinery while taking prescription pain medicine. °· Avoid driving for the first 24 hours after your procedure. °· Take frequent, short walks, followed by rest periods, throughout the day. Ask your health care provider what activities are safe for you. After 1-2 days, you may be able to return to your normal activities. °· Do not lift anything heavier than 10 lb (4.5 kg) until your health care provider approves. °· For at least 2 weeks, or as long as told by your health care provider, do not: °? Douche. °? Use tampons. °? Have sexual intercourse. °General instructions ° °· Take over-the-counter and prescription medicines only as told by your health care provider. This is especially important if you take blood thinning medicine. °· Do not take baths, swim, or use a hot tub until your health care provider approves. Take showers instead of baths. °· Wear compression stockings as told by your health care provider. These stockings help to prevent blood clots and reduce swelling in your legs. °· It is your responsibility to get the results of your procedure. Ask your health care provider, or the department performing the procedure, when your results will be ready. °· Keep all follow-up visits as told by your health care provider. This is important. °Contact a health care provider if: °· You have severe cramps that get worse or that do not get better with  medicine. °· You have severe abdominal pain. °· You cannot drink fluids without vomiting. °· You develop pain in a different area of your pelvis. °· You have bad-smelling vaginal discharge. °· You have a rash. °Get help right away if: °· You have vaginal bleeding that soaks more than one sanitary pad in 1 hour, for 2 hours in a row. °· You pass large blood clots from your vagina. °· You have a fever that is above 100.4°F (38.0°C). °· Your abdomen feels very tender or hard. °· You have chest pain. °· You have shortness of breath. °· You cough up blood. °· You feel dizzy or light-headed. °· You faint. °· You have pain in your neck or shoulder area. °This information is not intended to replace advice given to you by your health care provider. Make sure you discuss any questions you have with your health care provider. °Document Released: 04/01/2000 Document Revised: 03/17/2017 Document Reviewed: 11/05/2015 °Elsevier Patient Education © 2020 [employer] ° ° °Post Anesthesia Home Care Instructions ° °Activity: °Get plenty of rest for the remainder of the day. A responsible individual must stay with you for 24 hours following the procedure.  °For the next 24 hours, DO NOT: °-Drive a car °-Operate machinery °-Drink alcoholic beverages °-Take any medication unless instructed by your physician °-Make any legal decisions or sign important papers. ° °Meals: °Start with liquid foods such as gelatin or soup. Progress to regular foods as tolerated. Avoid greasy, spicy, heavy foods. If nausea and/or vomiting occur, drink only clear liquids until the nausea and/or vomiting   subsides. Call your physician if vomiting continues. ° °Special Instructions/Symptoms: °Your throat may feel dry or sore from the anesthesia or the breathing tube placed in your throat during surgery. If this causes discomfort, gargle with warm salt water. The discomfort should disappear within 24 hours. ° °If you had a scopolamine patch placed behind your ear  for the management of post- operative nausea and/or vomiting: ° °1. The medication in the patch is effective for 72 hours, after which it should be removed.  Wrap patch in a tissue and discard in the trash. Wash hands thoroughly with soap and water. °2. You may remove the patch earlier than 72 hours if you experience unpleasant side effects which may include dry mouth, dizziness or visual disturbances. °3. Avoid touching the patch. Wash your hands with soap and water after contact with the patch. °   ° ° °

## 2018-11-01 ENCOUNTER — Encounter (HOSPITAL_BASED_OUTPATIENT_CLINIC_OR_DEPARTMENT_OTHER): Payer: Self-pay | Admitting: Obstetrics and Gynecology

## 2019-10-14 ENCOUNTER — Other Ambulatory Visit: Payer: Self-pay | Admitting: Obstetrics and Gynecology

## 2019-10-14 DIAGNOSIS — Z1231 Encounter for screening mammogram for malignant neoplasm of breast: Secondary | ICD-10-CM

## 2019-10-25 ENCOUNTER — Other Ambulatory Visit: Payer: Self-pay

## 2019-10-25 ENCOUNTER — Ambulatory Visit
Admission: RE | Admit: 2019-10-25 | Discharge: 2019-10-25 | Disposition: A | Discharge status: Home / Self Care | Payer: BC Managed Care – PPO | Source: Ambulatory Visit | Attending: Obstetrics and Gynecology | Admitting: Obstetrics and Gynecology

## 2019-10-25 DIAGNOSIS — Z1231 Encounter for screening mammogram for malignant neoplasm of breast: Secondary | ICD-10-CM

## 2019-10-25 IMAGING — MG DIGITAL SCREENING BILAT W/ TOMO W/ CAD
6 of 10 series · 6 of 30 positions shown · non-contrast
Comparison: None.

CLINICAL DATA: Screening. This is the patient's initial baseline
mammogram.

EXAM:
DIGITAL SCREENING BILATERAL MAMMOGRAM WITH TOMO AND CAD

[L CC synth-2D (1 of 2)]
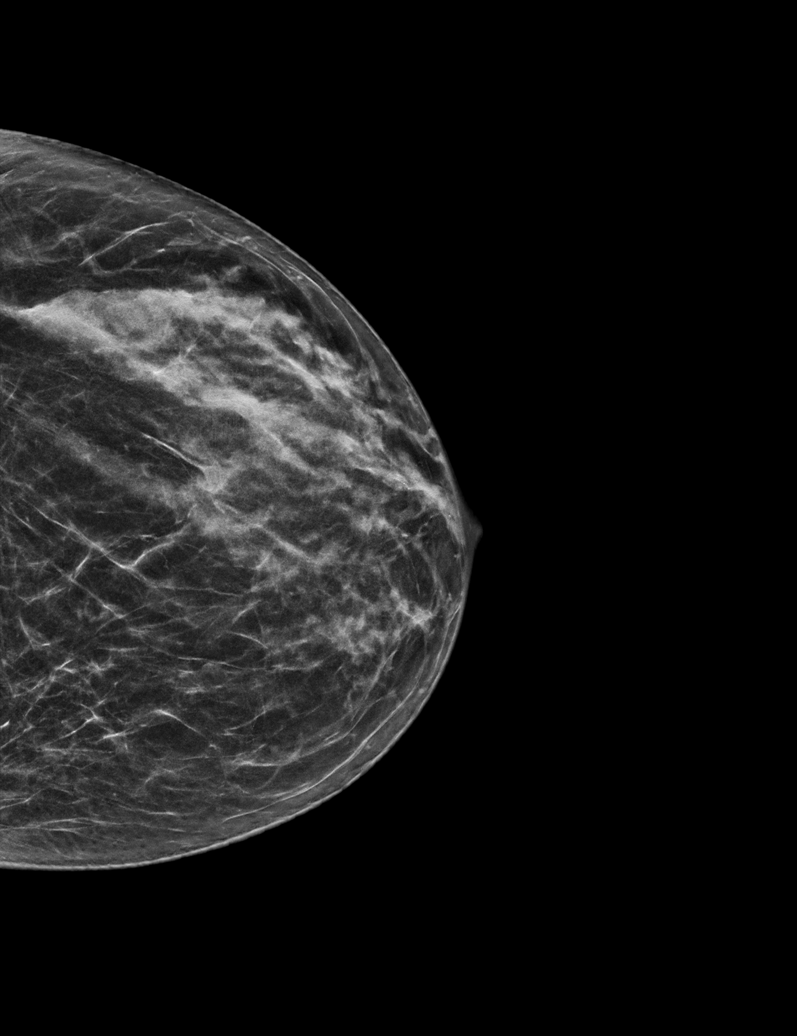

[L MLO synth-2D]
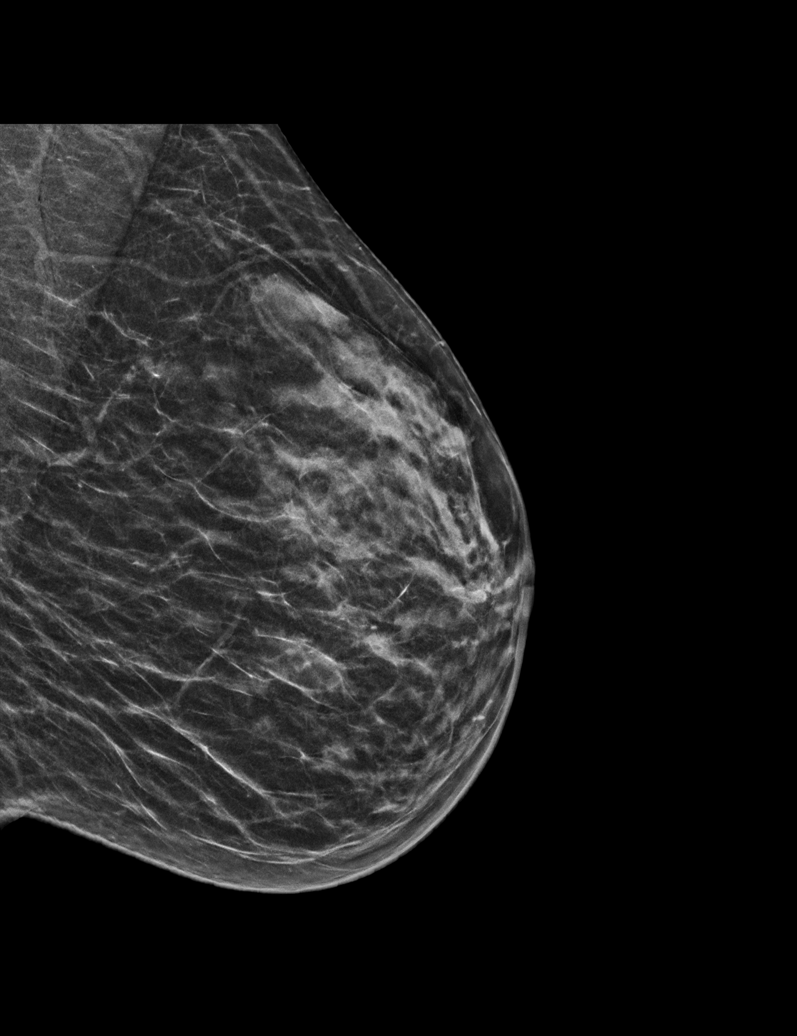

[R MLO synth-2D]
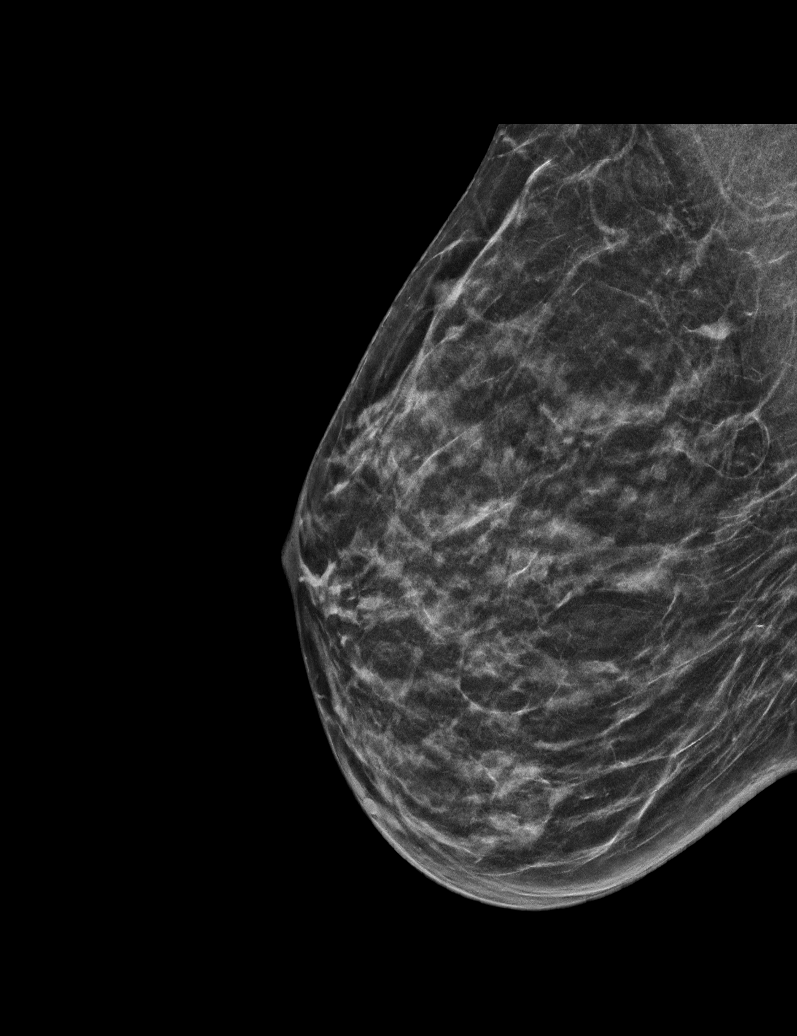

[R CC synth-2D]
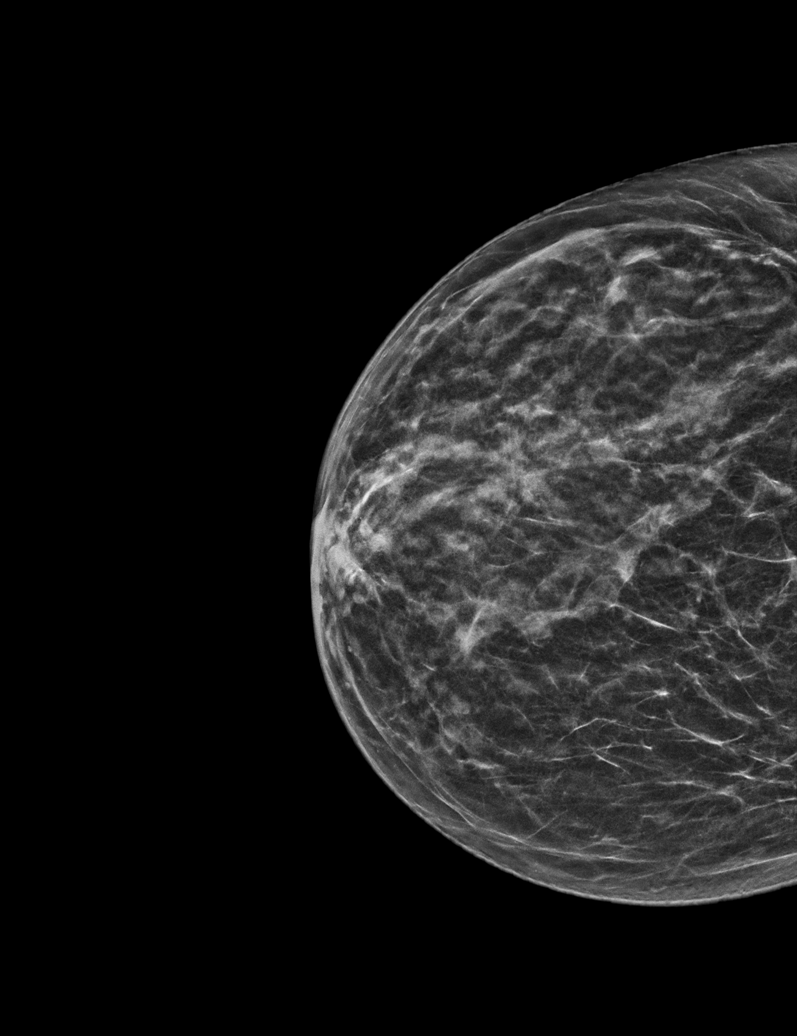

[L CC synth-2D (2 of 2)]
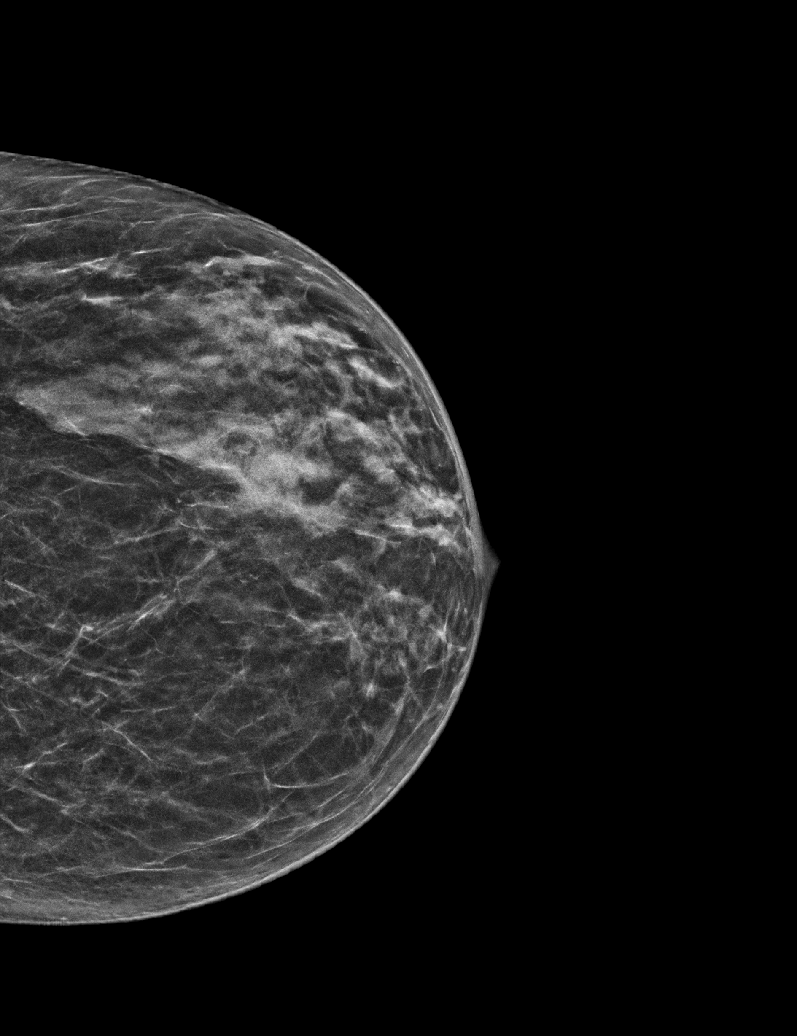

[L CC tomo · tomo slice 23/44.0]
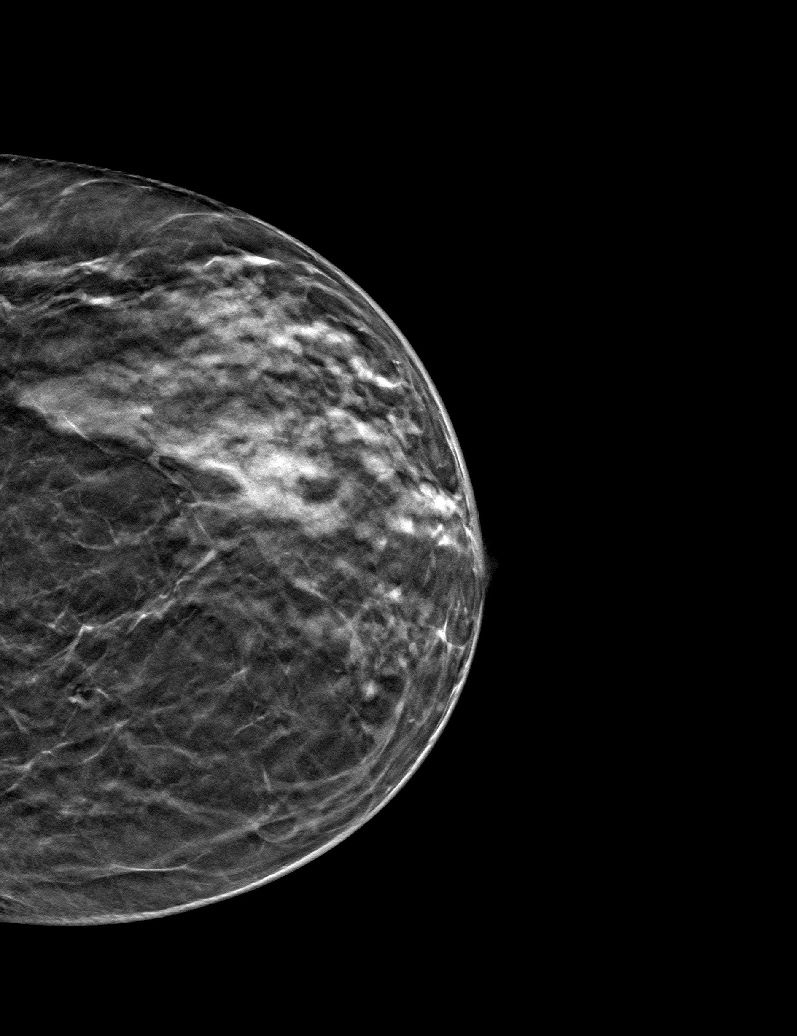

[6 of 30 positions shown; findings below may reference images not displayed]

ACR Breast Density Category c: The breast tissue is heterogeneously
dense, which may obscure small masses
FINDINGS: There are no findings suspicious for malignancy. Images were
processed with CAD.
IMPRESSION: No mammographic evidence of malignancy. A result letter of this
screening mammogram will be mailed directly to the patient.

RECOMMENDATION:
Screening mammogram in one year. (Code:[[place]])

BI-RADS CATEGORY  1: Negative.

## 2020-01-27 ENCOUNTER — Other Ambulatory Visit: Payer: Self-pay | Admitting: Family Medicine

## 2020-01-27 DIAGNOSIS — R200 Anesthesia of skin: Secondary | ICD-10-CM

## 2020-02-04 ENCOUNTER — Other Ambulatory Visit: Payer: Self-pay

## 2020-02-04 ENCOUNTER — Ambulatory Visit
Admission: RE | Admit: 2020-02-04 | Discharge: 2020-02-04 | Disposition: A | Discharge status: Home / Self Care | Payer: BC Managed Care – PPO | Source: Ambulatory Visit | Attending: Family Medicine | Admitting: Family Medicine

## 2020-02-04 DIAGNOSIS — R200 Anesthesia of skin: Secondary | ICD-10-CM

## 2020-02-04 IMAGING — MR MR CERVICAL SPINE W/O CM
4 of 5 series · 28 of 48 positions shown · non-contrast
Comparison: None.

CLINICAL DATA: Initial evaluation for neck pain with radiation
between scapula and down right arm. Numbness and tingling in hands
and face.

EXAM:
MRI CERVICAL SPINE WITHOUT CONTRAST
TECHNIQUE: Multiplanar, multisequence MR imaging of the cervical spine was
performed. No intravenous contrast was administered.

[Series 5: T2 · sagittal · 3.0mm · 0.66mm/px · 6 of 14 slices shown (1 of 2)]
[im 1/14]
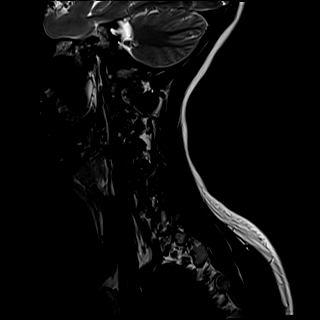
[im 3/14]
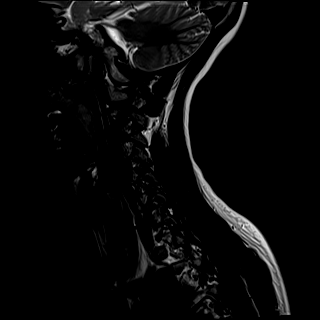
[im 6/14]
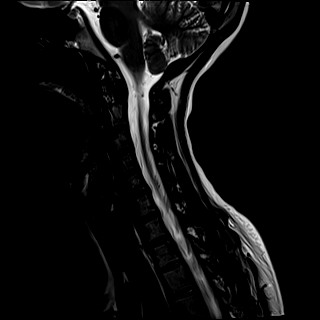
[im 8/14]
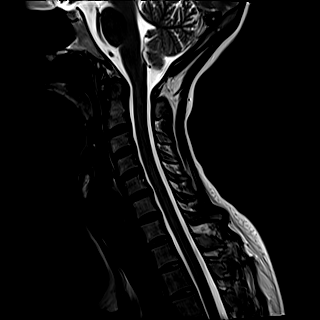
[im 11/14]
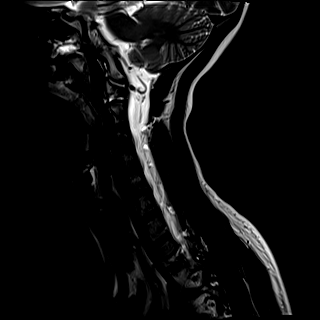
[im 14/14]
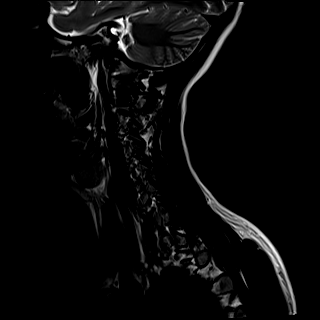

[Series 6: T1 · sagittal · 3.0mm · 0.66mm/px · 7 of 15 slices shown]
[im 1/15]
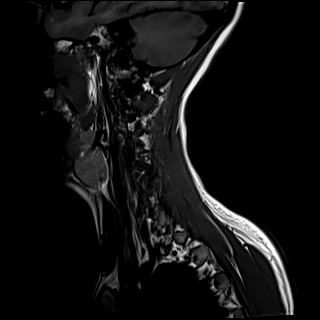
[im 3/15]
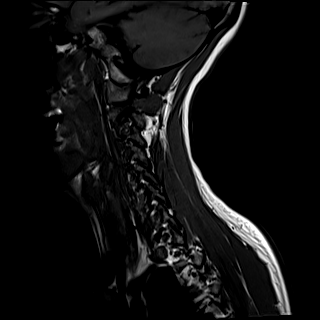
[im 5/15]
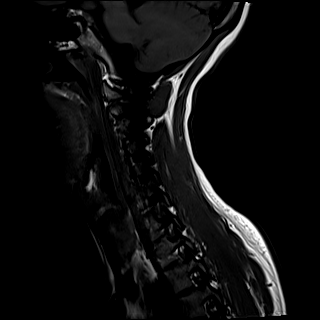
[im 8/15]
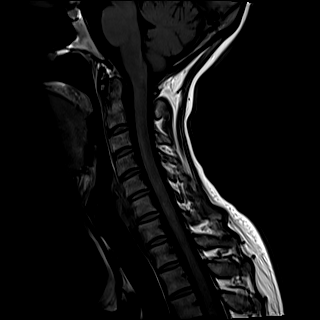
[im 10/15]
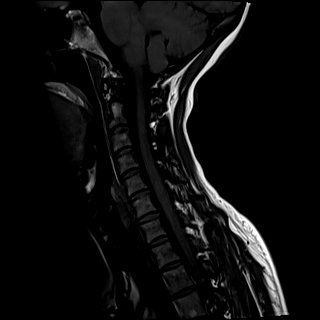
[im 12/15]
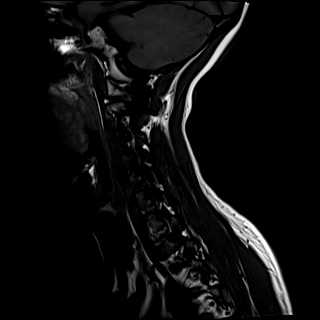
[im 15/15]
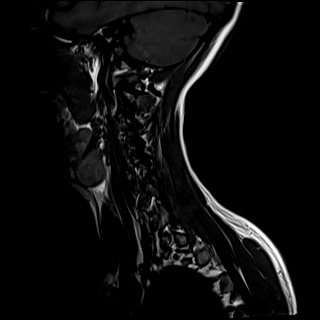

[Series 7: STIR · sagittal · 3.0mm · 0.33mm/px · 7 of 15 slices shown]
[im 1/15]
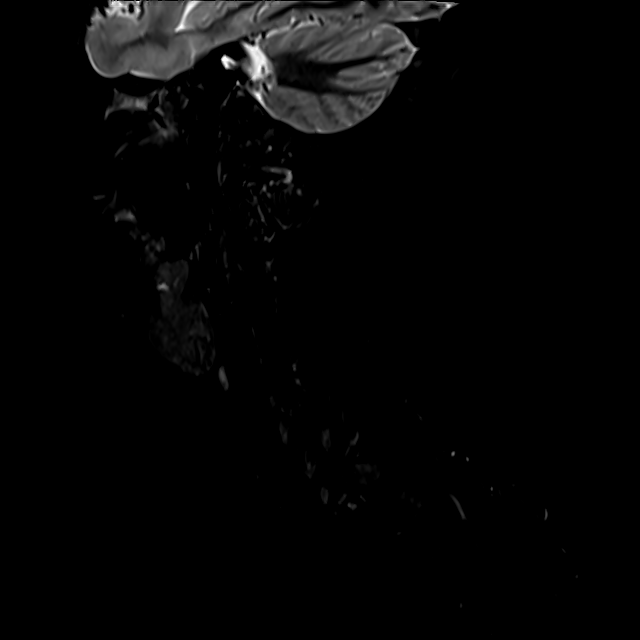
[im 3/15]
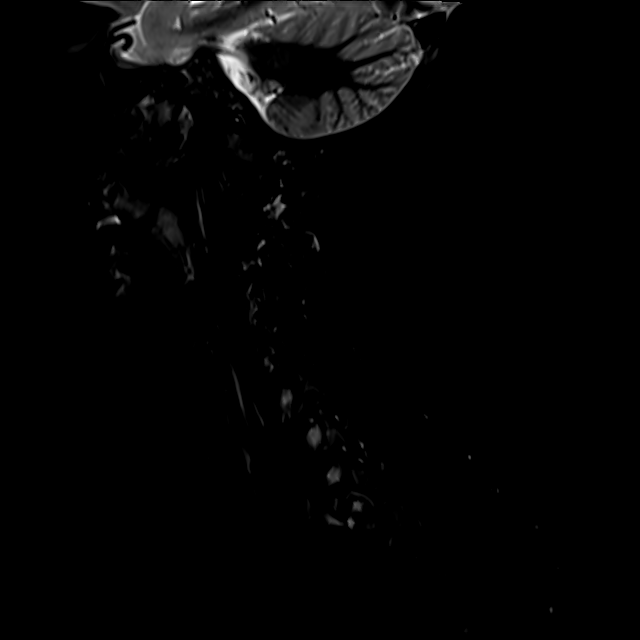
[im 5/15]
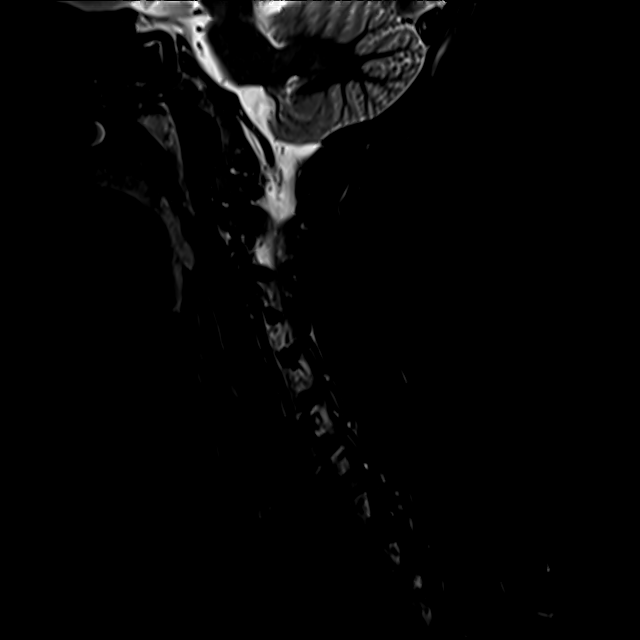
[im 8/15]
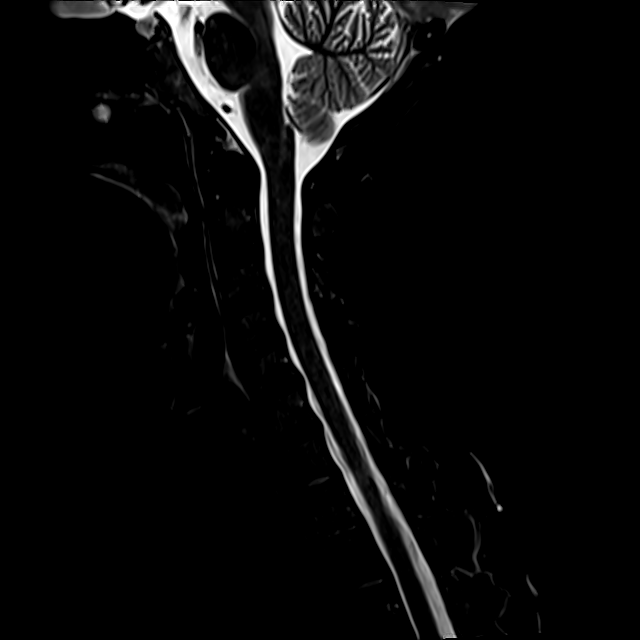
[im 10/15]
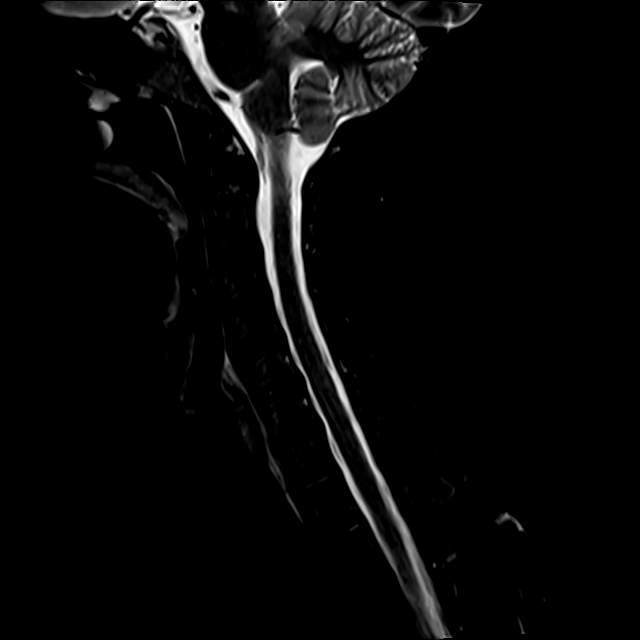
[im 12/15]
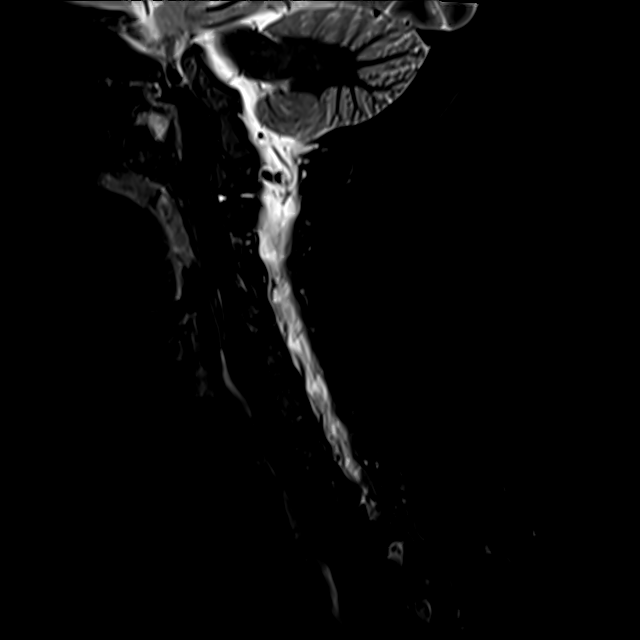
[im 15/15]
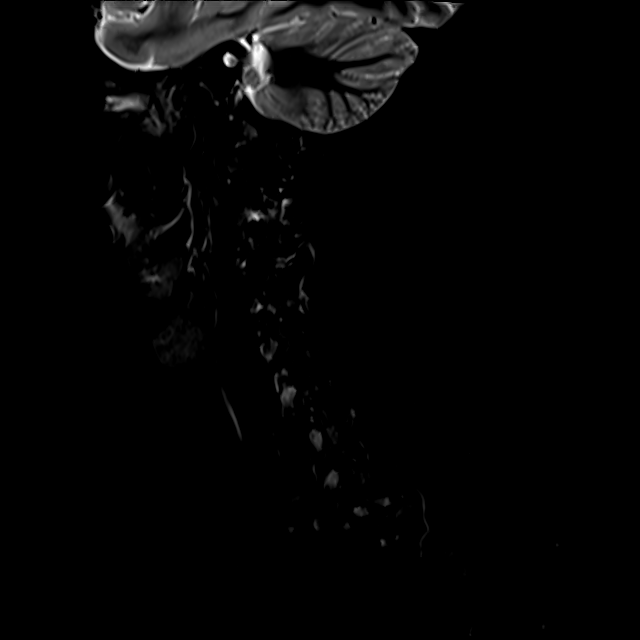

[Series 8: T2 · axial · 3.0mm · 0.50mm/px · z∈[-236,-143]mm · 8 of 30 slices shown (2 of 2)]
[im 1/30]
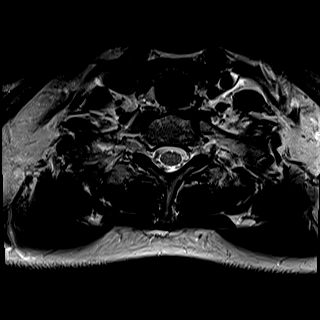
[im 5/30]
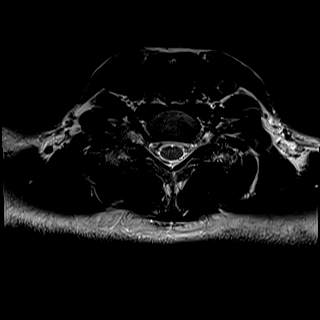
[im 9/30]
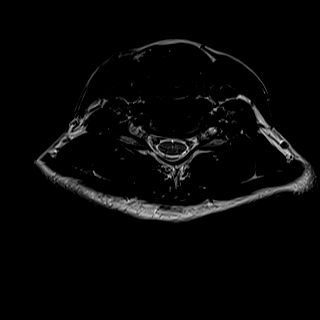
[im 14/30]
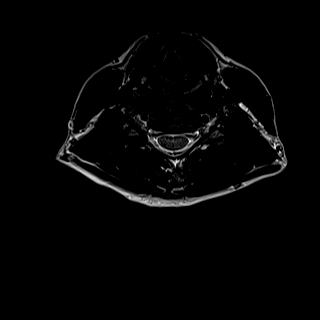
[im 16/30]
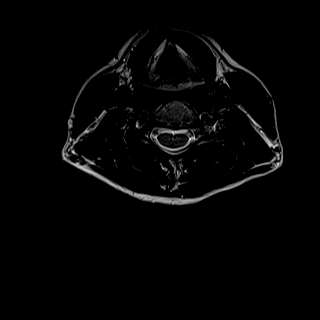
[im 21/30]
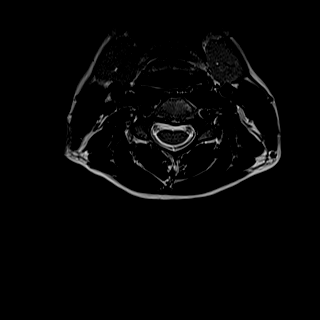
[im 25/30]
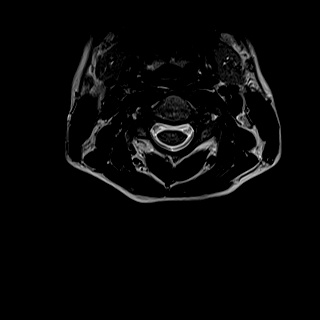
[im 30/30]
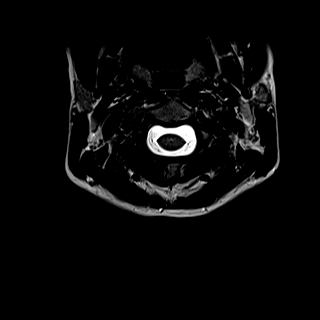

[28 of 48 positions shown; findings below may reference images not displayed]

FINDINGS: Alignment: Straightening of the normal cervical lordosis. No
listhesis.

Vertebrae: Vertebral body height well maintained without acute or
chronic fracture. Bone marrow signal intensity within normal limits.
Subcentimeter benign hemangioma noted within the T3 vertebral body.
No other discrete or worrisome osseous lesions. No abnormal marrow
edema.

Cord: Normal signal and morphology.

Posterior Fossa, vertebral arteries, paraspinal tissues:
Unremarkable.

Disc levels:

C2-C3: Unremarkable.

C3-C4: Shallow central disc protrusion minimally indents the ventral
thecal sac (series 9, image 8). No significant spinal stenosis.
Foramina remain patent.

C4-C5:  Minimal annular disc bulge.  No canal or foraminal stenosis.

C5-C6: Tiny central disc protrusion indents the ventral thecal sac
(series 9, image 17). No significant spinal stenosis or cord
deformity. Foramina remain patent.

C6-C7: Mild annular disc bulge. No significant canal or foraminal
stenosis.

C7-T1:  Unremarkable.

Visualized upper thoracic spine demonstrates no significant finding.
IMPRESSION: 1. Tiny central disc protrusions at C3-4 and C5-6 without
significant stenosis or neural impingement.
2. Additional mild noncompressive disc bulging at C4-5 and C6-7
without stenosis.
3. Otherwise unremarkable MRI of the cervical spine.

## 2020-05-13 ENCOUNTER — Ambulatory Visit: Payer: BC Managed Care – PPO | Admitting: Neurology

## 2020-07-13 ENCOUNTER — Encounter: Payer: Self-pay | Admitting: Neurology

## 2020-07-13 ENCOUNTER — Ambulatory Visit (INDEPENDENT_AMBULATORY_CARE_PROVIDER_SITE_OTHER): Payer: BC Managed Care – PPO | Admitting: Neurology

## 2020-07-13 VITALS — BP 132/84 | HR 98 | Ht 61.0 in | Wt 113.3 lb

## 2020-07-13 DIAGNOSIS — R251 Tremor, unspecified: Secondary | ICD-10-CM

## 2020-07-13 NOTE — Progress Notes (Signed)
Subjective:    Patient ID: Renee Roach is a 40 y.o. female.  HPI     Star Age, MD, PhD [facility] [address] [place], [place] [address]  Dear Dr. Nancy Fetter,   I saw your patient, Renee Roach, upon your kind request in my neurologic clinic today for initial consultation of her tremors.  The patient is unaccompanied today.  As you know, [name] is a 40 year old right-handed woman with an underlying medical history of depression, anxiety, insomnia, and alcohol use disorder (by chart review), who reports an approximately 2-year history of hand tremors.  They have not been progressive.  Tremors are primarily noted when she is doing something with her hands or when they are outstretched.  Sometimes she even notices a tremor in her neck area and in her feet.  She reports that she had an episode of numbness for which she went to the emergency room in May 2020.  I reviewed the emergency room records from 09/12/2018.  She reported neck pain at the time as well.  She was treated symptomatically with Advil, Robaxin, and Valium.  She reports midline neck pain.  Sometimes she has tingling in her right arm but no chronic numbness.  She denies any weakness.  She does report dropping things sometimes.  She works at [employer] a lot.  She has noticed stress as a trigger.  She tries to meditate every day.  She hydrates well with water, she limits her caffeine to 1 cup of coffee in the morning.  She sleeps well.  Trazodone helps her obtain about 7 to even 8 hours of sleep.  She denies a family history of tremors.  Of note, she has been on Lamictal since 2015.  I reviewed your office note from 02/04/2020.  She had blood work through your office on 02/04/2020 and I reviewed the results: Glucose 68, BUN 15, creatinine 1.08, ALP 30, AST 12, ALT 10.  Lamotrigine level 1.7.  She had a recent cervical spine MRI without contrast on 02/04/2020 and I reviewed  the results:   IMPRESSION: 1. Tiny central disc protrusions at C3-4 and C5-6 without significant stenosis or neural impingement. 2. Additional mild noncompressive disc bulging at C4-5 and C6-7 without stenosis. 3. Otherwise unremarkable MRI of the cervical spine.   Her Past Medical History Is Significant For: Past Medical History:  Diagnosis Date  . Alcohol abuse   . Anxiety   . Bipolar 1 disorder ([place])   . Depression     Her Past Surgical History Is Significant For: Past Surgical History:  Procedure Laterality Date  . ADENOIDECTOMY    . DILATATION & CURETTAGE/HYSTEROSCOPY WITH MYOSURE N/A 10/31/2018   Procedure: DILATATION & CURETTAGE/HYSTEROSCOPY WITH MYOSURE;  Surgeon: Thurnell Lose, MD;  Location: [place] [facility];  Service: Gynecology;  Laterality: N/A;    Her Family History Is Significant For: Family History  Problem Relation Age of Onset  . Breast cancer Neg Hx   . BRCA 1/2 Neg Hx     Her Social History Is Significant For: Social History   Socioeconomic History  . Marital status: Single    Spouse name: Not on file  . Number of children: Not on file  . Years of education: Not on file  . Highest education level: Not on file  Occupational History  . Not on file  Tobacco Use  . Smoking status: Never Smoker  . Smokeless tobacco: Never Used  Vaping Use  . Vaping  Use: Never used  Substance and Sexual Activity  . Alcohol use: Not Currently    Comment: in recovery since 2015  . Drug use: Never  . Sexual activity: Not on file  Other Topics Concern  . Not on file  Social History Narrative  . Not on file   Social Determinants of Health   Financial Resource Strain: Not on file  Food Insecurity: Not on file  Transportation Needs: Not on file  Physical Activity: Not on file  Stress: Not on file  Social Connections: Not on file    Her Allergies Are:  Allergies  Allergen Reactions  . Sulfa Antibiotics Nausea And Vomiting  :   Her Current  Medications Are:  Outpatient Encounter Medications as of 07/13/2020  Medication Sig  . buPROPion (WELLBUTRIN SR) 150 MG 12 hr tablet Take 150 mg by mouth 2 (two) times daily.  Marland Kitchen ibuprofen (ADVIL) 600 MG tablet Take 1 tablet (600 mg total) by mouth every 6 (six) hours as needed.  . lamoTRIgine (LAMICTAL) 25 MG tablet Take 25 mg by mouth daily.  . Multiple Vitamin (MULTIVITAMIN) capsule Take 1 capsule by mouth daily.  . traZODone (DESYREL) 50 MG tablet Take 50 mg by mouth at bedtime.   No facility-administered encounter medications on file as of 07/13/2020.  :   Review of Systems:  Out of a complete 14 point review of systems, all are reviewed and negative with the exception of these symptoms as listed below:  Review of Systems  Neurological:       Here for consult on worsening tremors. Reports tremors started back in May of 2020. Reports sx are in bilateral hands but more so in the right hand. She is right hand dominate.  She also reports pain at the base of her neck. MRI was completed back in Dec or Nov of 2021, no abnormalities reported.      Objective:  Neurological Exam  Physical Exam Physical Examination:   Vitals:   07/13/20 0741  BP: 132/84  Pulse: 98  SpO2: 97%    General Examination: The patient is a very pleasant 40 y.o. female in no acute distress. She appears well-developed and well-nourished and well groomed.   HEENT: Normocephalic, atraumatic, pupils are equal, round and reactive to light. Extraocular tracking is good without limitation to gaze excursion or nystagmus noted. Normal smooth pursuit is noted. Hearing is grossly intact. Face is symmetric with normal facial animation and normal facial sensation. Speech is clear with no dysarthria noted. There is no hypophonia. There is no lip, or jaw tremor or voice tremor.  She has a slight intermittent neck tremor. Neck is supple with full range of passive and active motion. There are no carotid bruits on auscultation.  Oropharynx exam reveals: no signif. mouth dryness, good dental hygiene. Tongue protrudes centrally and palate elevates symmetrically.   Chest: Clear to auscultation without wheezing, rhonchi or crackles noted.  Heart: S1+S2+0, regular and normal without murmurs, rubs or gallops noted.   Abdomen: Soft, non-tender and non-distended with normal bowel sounds appreciated on auscultation.  Extremities: There is no pitting edema in the distal lower extremities bilaterally. Pedal pulses are intact.  Skin: Warm and dry without trophic changes noted.  Musculoskeletal: exam reveals no obvious joint deformities, tenderness or joint swelling or erythema.   Neurologically:  Mental status: The patient is awake, alert and oriented in all 4 spheres. Her immediate and remote memory, attention, language skills and fund of knowledge are appropriate. There is no evidence  of aphasia, agnosia, apraxia or anomia. Speech is clear with normal prosody and enunciation. Thought process is linear. Mood is normal and affect is normal.  Cranial nerves II - XII are as described above under HEENT exam. In addition: shoulder shrug is normal with equal shoulder height noted. Motor exam: Normal bulk, strength and tone is noted. There is no drift, resting tremor or rebound.  On 07/13/2020: Archimedes spiral drawing shows slight trembling with the right hand which is her dominant hand, mild trembling of the left hand, handwriting is legible, slightly tremulous, on the smaller side.  She has a bilateral upper extremity mild postural and action tremor, no intention tremor. Romberg is negative. Reflexes are 2+ throughout. Babinski: Toes are flexor bilaterally. Fine motor skills and coordination: intact with normal finger taps, normal hand movements, normal rapid alternating patting, normal foot taps and normal foot agility.  Cerebellar testing: No dysmetria or intention tremor on finger to nose testing. Heel to shin is unremarkable  bilaterally. There is no truncal or gait ataxia.  Sensory exam: intact to light touch, vibration, temperature sense in the upper and lower extremities.  Gait, station and balance: She stands easily. No veering to one side is noted. No leaning to one side is noted. Posture is age-appropriate and stance is narrow based. Gait shows normal stride length and normal pace. No problems turning are noted. Tandem walk is unremarkable.   Assessment and Plan:   In summary, Camila Maita is a very pleasant 40 y.o.-year old female with an underlying medical history of depression, anxiety, insomnia, and alcohol use disorder (by chart review), who presents for evaluation of her hand tremor of nearly 2 years duration.  Examination shows a mild bilateral hand tremor, slight intermittent neck tremor as well.  No evidence of parkinsonism.  She does not have a telltale family history of essential tremor but she could have a mild/Beginning form of essential tremor, contributing factors could include anxiety, stress, and medication side effect from the Lamictal.  I talked to the patient about this today.  I would not favor any symptomatic medication for her at this time given the mild form of tremor and we do not have a whole lot of medication options for symptomatic treatment of essential tremor, Mysoline can be used but can cause sleepiness and she is already on trazodone for sleep and other psychotropic medications. A beta-blocker, particularly propranolol can be considered, but can cause dizziness, low blood pressure, low pulse rate, and more pertinent in her case exacerbation of depression and certain susceptible patients.  I would rather avoid this medication altogether if possible.  She has an otherwise fairly nonfocal examination.  For her neck pain she is advised to talk to you about seeing a spine specialist.  She had a recent cervical spine MRI which was quite benign.  Certainly, there was no evidence of  significant neuroforaminal stenoses or disc herniation.  We talked about tremor triggers also today.  She is advised to continue to stay well-hydrated with water, well rested, continue to pursue relaxation techniques.  Her anxiety is reasonably well managed per her report.  I would like to make sure her thyroid function is okay.  In addition, I would recommend vitamin B 12 and vitamin D check.  She has an appointment pending with you next month and will have blood work at the time and she is advised to talk to you about getting these few tests done as well.  She is advised to follow-up in  this clinic in about 6 months for recheck, sooner if needed.  I answered all her questions today and she was in agreement.  Thank you very much for allowing me to participate in the care of this nice patient. If I can be of any further assistance to you please do not hesitate to call me at [phone].  Sincerely,   Star Age, MD, PhD

## 2020-07-13 NOTE — Patient Instructions (Signed)
You have a mild tremor of both hands.  It is possible that you have a mild form of essential tremor which is often hereditary. I do not see any signs or symptoms of parkinson's like disease or what we call parkinsonism.   For your tremor, I would not recommend any new medication for fear of side effects (such as sleepiness, balance disorder, low blood pressure, low pulse rate, dizziness, and exacerbation of depression).   Certain medications can cause tremors, this includes SSRI type antidepressants often and also Lamictal which can cause tremors in up to 10% of patients.    I recommend that we continue to follow you clinically.  Since you have an appointment pending with Dr. Wynelle Link next month with blood work, I recommend that you get your thyroid function checked, also vitamin B12 and vitamin D levels.     Please remember, that any kind of tremor may be exacerbated by stress, anxiety, anger, nervousness, excitement, dehydration, sleep deprivation, by caffeine, and low blood sugar values or blood sugar fluctuations and by thyroid dysfunction.

## 2020-12-10 ENCOUNTER — Other Ambulatory Visit: Payer: Self-pay | Admitting: Ophthalmology

## 2020-12-10 DIAGNOSIS — H4710 Unspecified papilledema: Secondary | ICD-10-CM

## 2021-01-11 ENCOUNTER — Other Ambulatory Visit: Payer: Self-pay

## 2021-01-11 ENCOUNTER — Ambulatory Visit
Admission: RE | Admit: 2021-01-11 | Discharge: 2021-01-11 | Disposition: A | Discharge status: Home / Self Care | Payer: BC Managed Care – PPO | Source: Ambulatory Visit | Attending: Ophthalmology | Admitting: Ophthalmology

## 2021-01-11 DIAGNOSIS — H4710 Unspecified papilledema: Secondary | ICD-10-CM

## 2021-01-11 IMAGING — MR MR ORBITS WO/W CM
11 of 13 series · 33 of 48 positions shown · IV contrast (9 ml multihance)
Comparison: None.

CLINICAL DATA: Papilledema [Z8] ([Z8]-CM)

EXAM:
MRI HEAD AND ORBITS WITHOUT AND WITH CONTRAST
TECHNIQUE: Multiplanar, multiecho pulse sequences of the brain and surrounding
structures were obtained without and with intravenous contrast.
Multiplanar, multiecho pulse sequences of the orbits and surrounding
structures were obtained including fat saturation techniques, before
and after intravenous contrast administration.
CONTRAST:  9mL MULTIHANCE GADOBENATE DIMEGLUMINE 529 MG/ML IV SOLN

[Series 9: T1 · sagittal · 4.0mm · 0.75mm/px · 2 of 27 slices shown (1 of 5)]
[im 1/27]
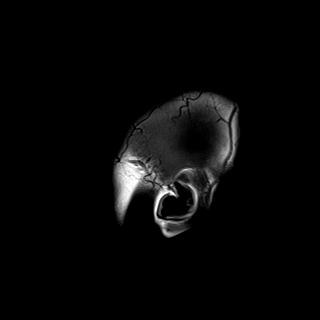
[im 27/27]
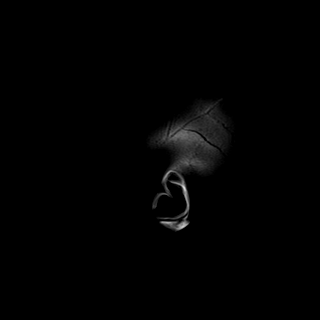

[Series 15: T2 · axial · 4.0mm · 0.36mm/px · z∈[-81,+69]mm · 2 of 30 slices shown (1 of 2)]
[im 1/30]
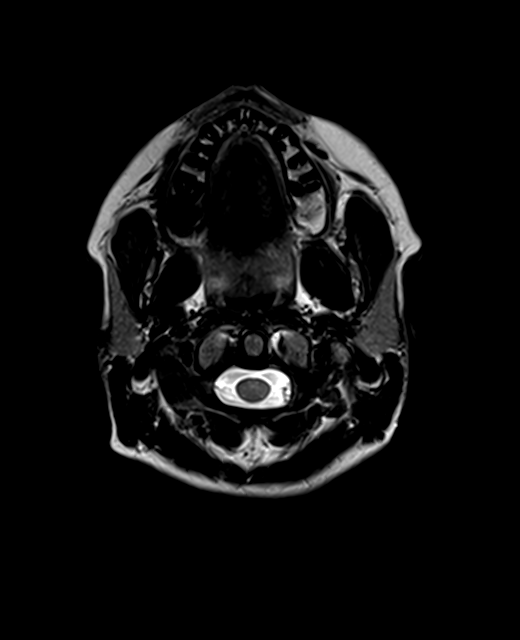
[im 30/30]
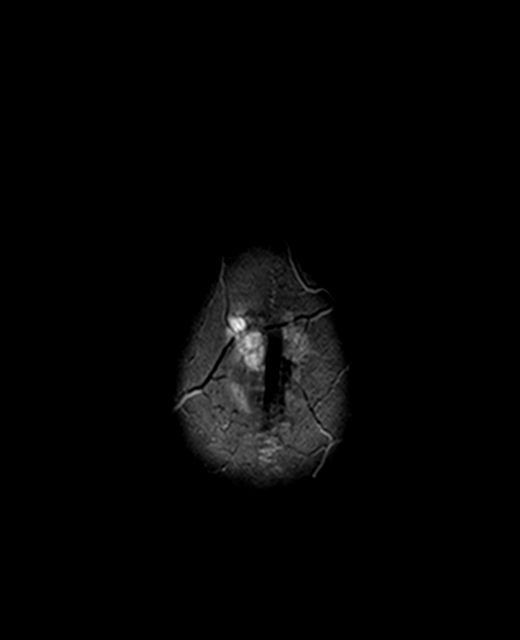

[Series 16: FLAIR · axial · 3.0mm · 0.72mm/px · z∈[-82,+67]mm · 2 of 26 slices shown]
[im 1/26]
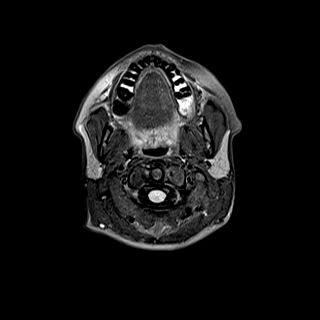
[im 26/26]
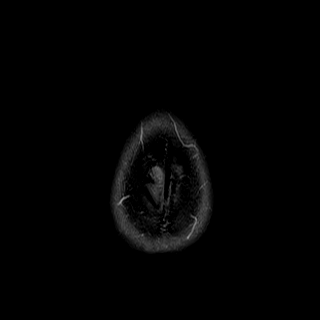

[Series 19: T2 · coronal · 3.0mm · 0.56mm/px · 2 of 25 slices shown (2 of 2)]
[im 1/25]
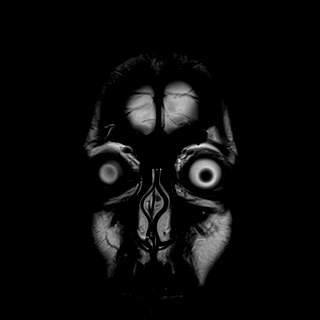
[im 25/25]
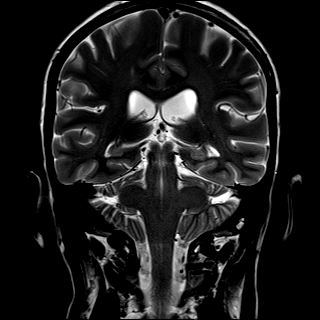

[Series 21: T2 fat-sat · axial · 2.5mm · 0.25mm/px · z∈[-56,+12]mm · 2 of 26 slices shown (1 of 2)]
[im 1/26]
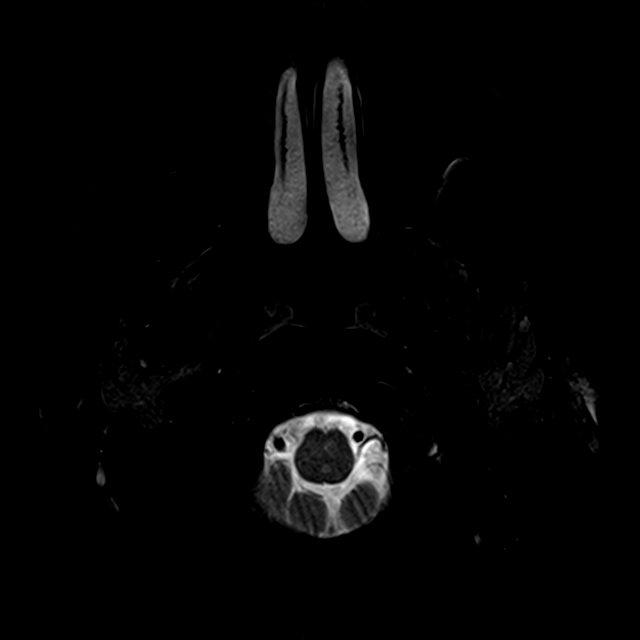
[im 26/26]
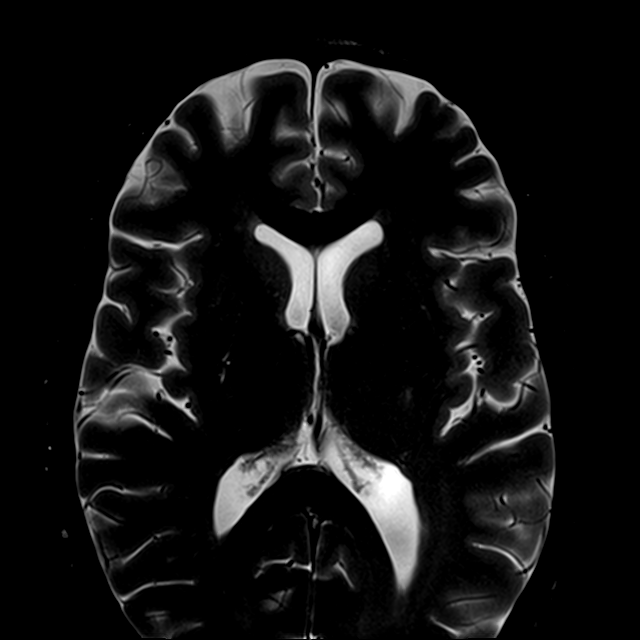

[Series 22: T2 fat-sat · coronal · 3.0mm · 0.25mm/px · 2 of 25 slices shown (2 of 2)]
[im 1/25]
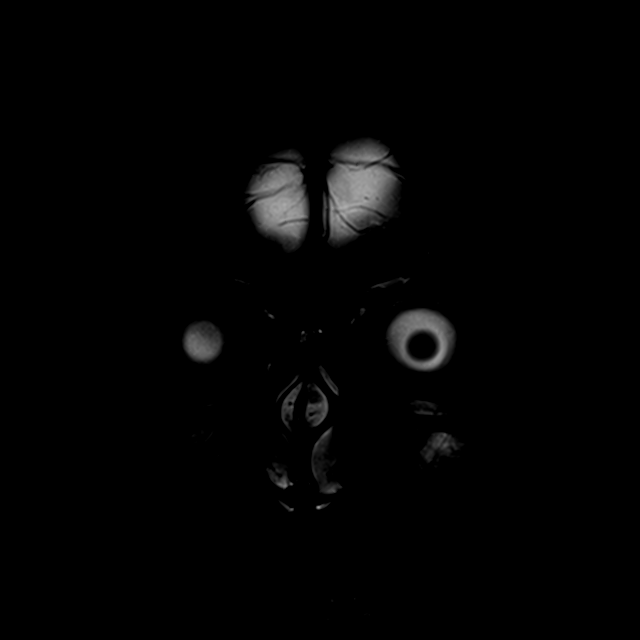
[im 25/25]
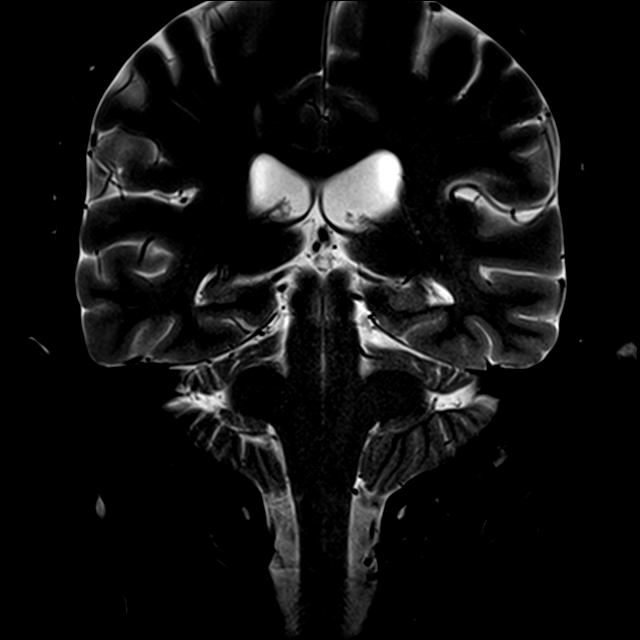

[Series 23: T1 · axial · 2.5mm · 0.31mm/px · z∈[-56,+12]mm · 2 of 26 slices shown (2 of 5)]
[im 1/26]
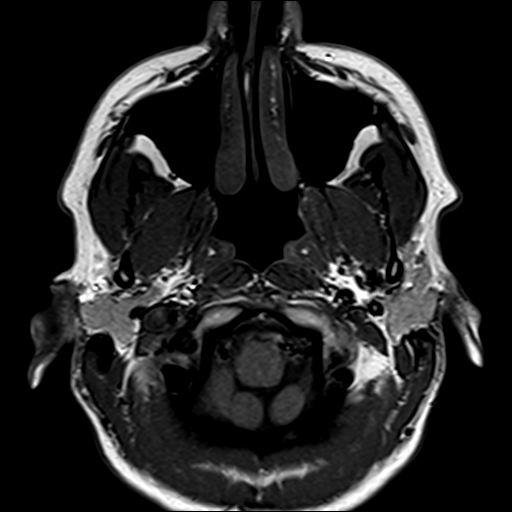
[im 26/26]
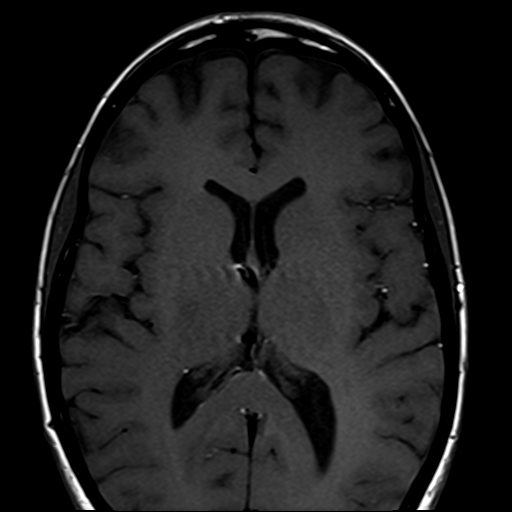

[Series 24: T1 · coronal · 3.0mm · 0.35mm/px · 2 of 25 slices shown (3 of 5)]
[im 1/25]
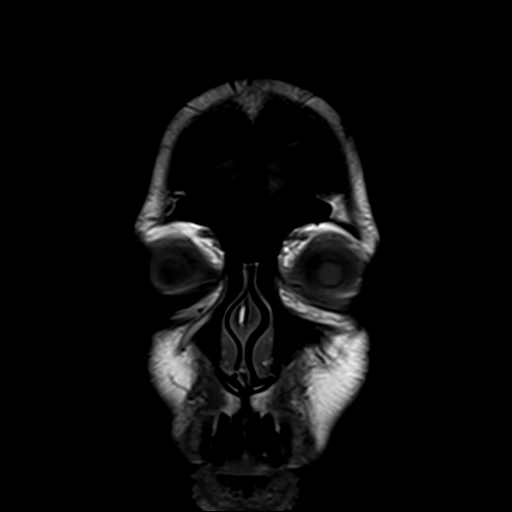
[im 25/25]
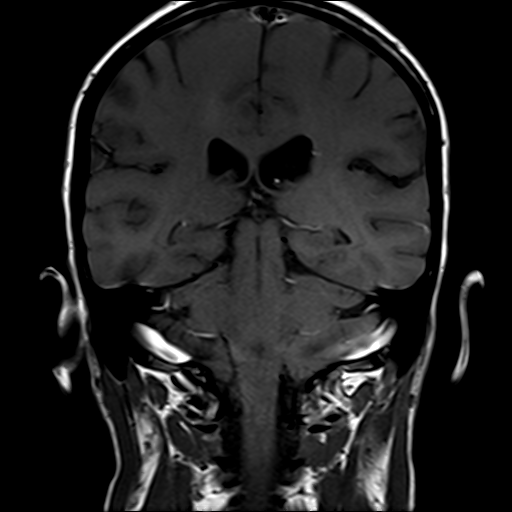

[Series 26: T1 · axial · 1.0mm · 0.94mm/px · z∈[-91,+67]mm · 8 of 160 slices shown (4 of 5)]
[im 1/160]
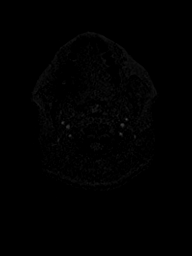
[im 29/160]
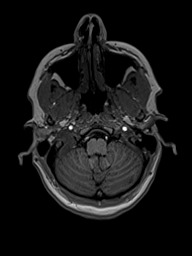
[im 44/160]
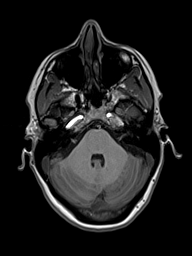
[im 73/160]
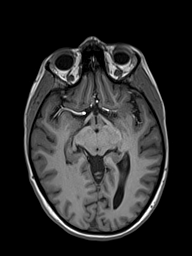
[im 87/160]
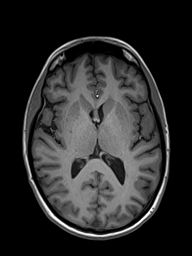
[im 116/160]
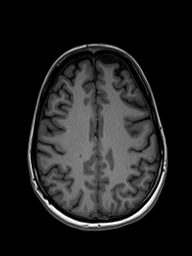
[im 131/160]
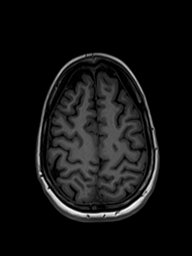
[im 160/160]
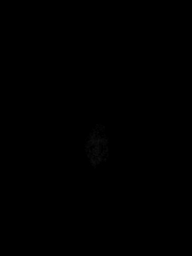

[Series 32: T1 · axial · 1.0mm · 0.94mm/px · z∈[-114,+0]mm · 6 of 160 slices shown (5 of 5)]
[im 1/160]
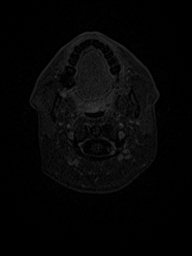
[im 29/160]
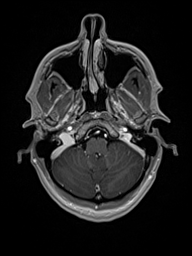
[im 44/160]
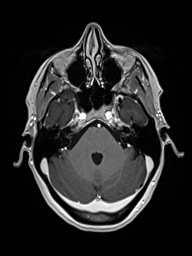
[im 73/160]
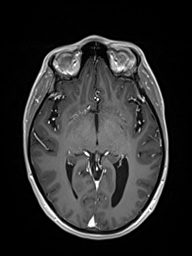
[im 87/160]
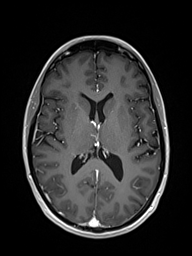
[im 116/160]
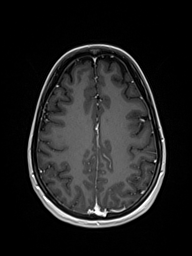

[Series 33: T1 post-contrast · coronal · 4.0mm · 0.72mm/px · 3 of 34 slices shown]
[im 1/34]
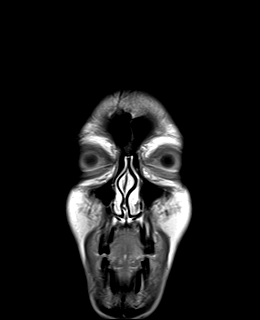
[im 17/34]
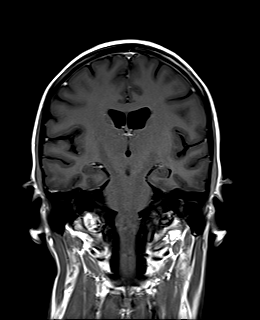
[im 34/34]
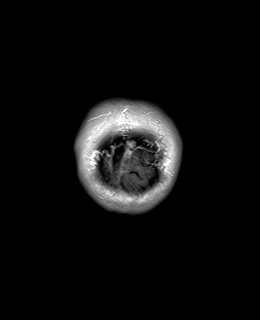

[33 of 48 positions shown; findings below may reference images not displayed]

FINDINGS: MRI HEAD FINDINGS

Brain: No acute infarction, hemorrhage, hydrocephalus, extra-axial
collection or mass lesion. Normal position of the cerebellar
tonsils. The sella is not empty. Mild scattered T2 hyperintensities
in the white matter. No abnormal enhancement. Patent dural venous
sinuses, which do not appear stenotic.

Vascular: Major arterial flow voids are maintained at the skull
base. Tiny left frontal developmental venous anomaly, incidental.

Skull and upper cervical spine: Normal marrow signal.

Other: No mastoid effusions.

MRI ORBITS FINDINGS

Orbits: No traumatic or inflammatory finding. Globes, orbital fat,
extraocular muscles, vascular structures, and lacrimal glands are
normal. Mild tortuosity of the optic nerves. No flattening of the
posterior globes or protrusion of the optic papilla.

Visualized sinuses: Clear.

Soft tissues: Negative.
IMPRESSION: MRI head:

1. No evidence of acute intracranial abnormality.
2. Mild scattered T2 hyperintensities in the white matter, not
advanced for age. These are nonspecific and could be secondary to
chronic microvascular ischemic disease, chronic headaches, or prior
demyelination.

MRI orbits:

Mild tortuosity of the optic nerves bilaterally, which is
nonspecific but can be seen with increased intracranial pressures.
Otherwise, unremarkable orbits.

## 2021-01-11 MED ORDER — GADOBENATE DIMEGLUMINE 529 MG/ML IV SOLN
9.0000 mL | Freq: Once | INTRAVENOUS | Status: AC | PRN
  Administered 2021-01-11: 9 mL via INTRAVENOUS

## 2021-01-13 ENCOUNTER — Ambulatory Visit: Payer: BC Managed Care – PPO | Admitting: Neurology

## 2021-03-29 ENCOUNTER — Ambulatory Visit: Payer: BC Managed Care – PPO | Admitting: Neurology

## 2021-06-17 ENCOUNTER — Ambulatory Visit: Payer: BC Managed Care – PPO | Admitting: Neurology

## 2021-07-23 ENCOUNTER — Other Ambulatory Visit: Payer: Self-pay | Admitting: Family Medicine

## 2021-07-23 DIAGNOSIS — Z1231 Encounter for screening mammogram for malignant neoplasm of breast: Secondary | ICD-10-CM

## 2021-07-27 ENCOUNTER — Ambulatory Visit
Admission: RE | Admit: 2021-07-27 | Discharge: 2021-07-27 | Disposition: A | Discharge status: Home / Self Care | Payer: BC Managed Care – PPO | Source: Ambulatory Visit

## 2021-07-27 DIAGNOSIS — Z1231 Encounter for screening mammogram for malignant neoplasm of breast: Secondary | ICD-10-CM

## 2021-07-27 IMAGING — MG MM DIGITAL SCREENING BILAT W/ TOMO AND CAD
8 series · 9 of 24 positions shown · non-contrast
Comparison: Previous exam(s).

CLINICAL DATA: Screening.

EXAM:
DIGITAL SCREENING BILATERAL MAMMOGRAM WITH TOMOSYNTHESIS AND CAD
TECHNIQUE: Bilateral screening digital craniocaudal and mediolateral oblique
mammograms were obtained. Bilateral screening digital breast
tomosynthesis was performed. The images were evaluated with
computer-aided detection.

[R MLO synth-2D]
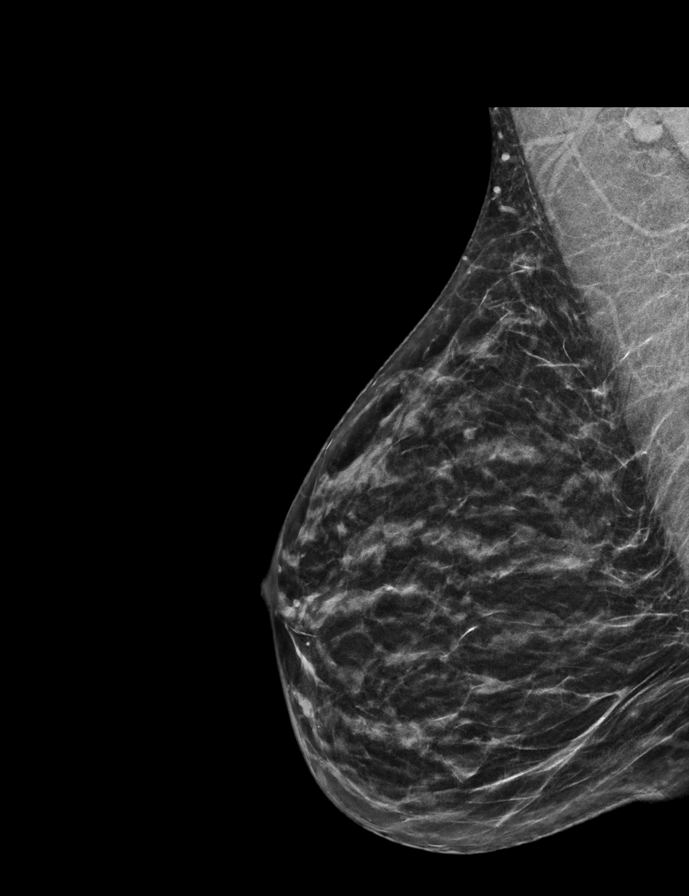

[L CC synth-2D]
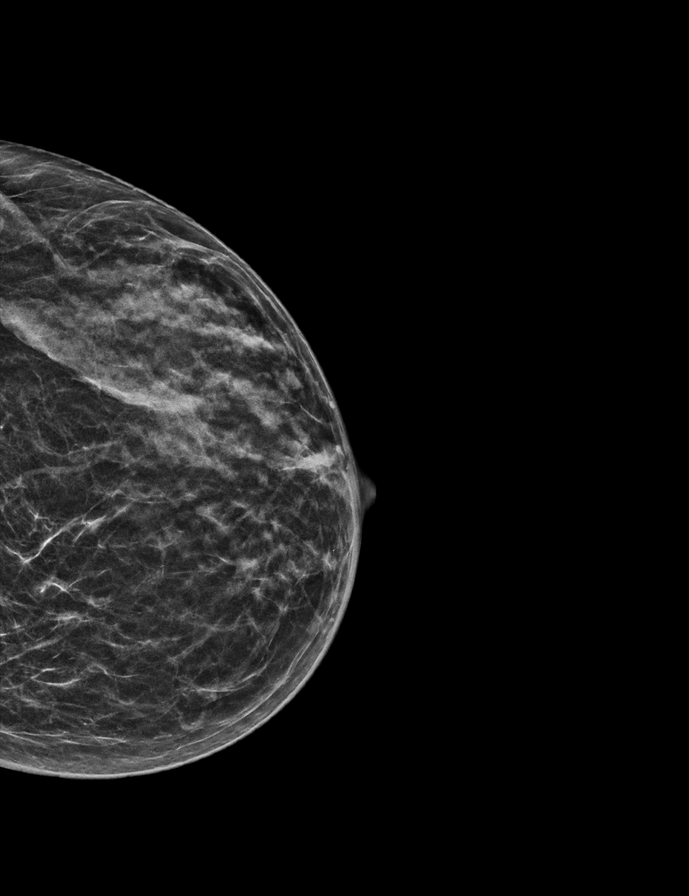

[L MLO synth-2D]
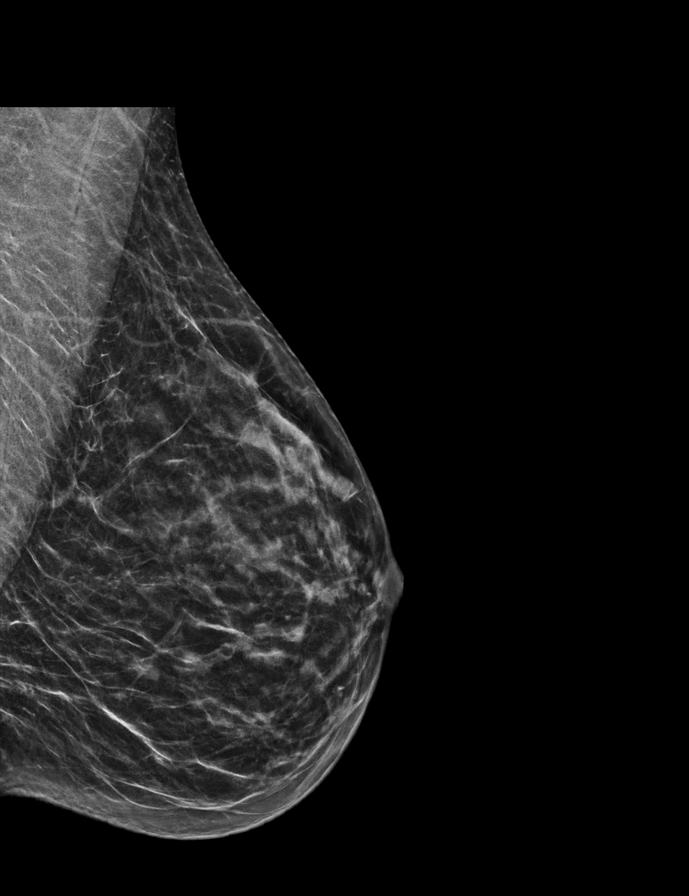

[R CC synth-2D]
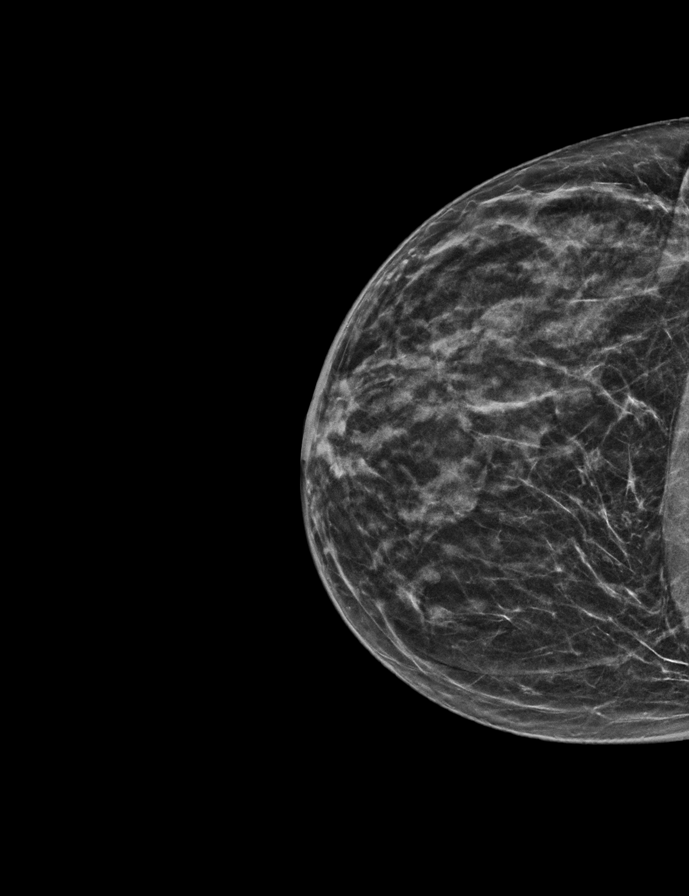

[R MLO tomo · 2 of 45 frames shown]
[frame 15/45]
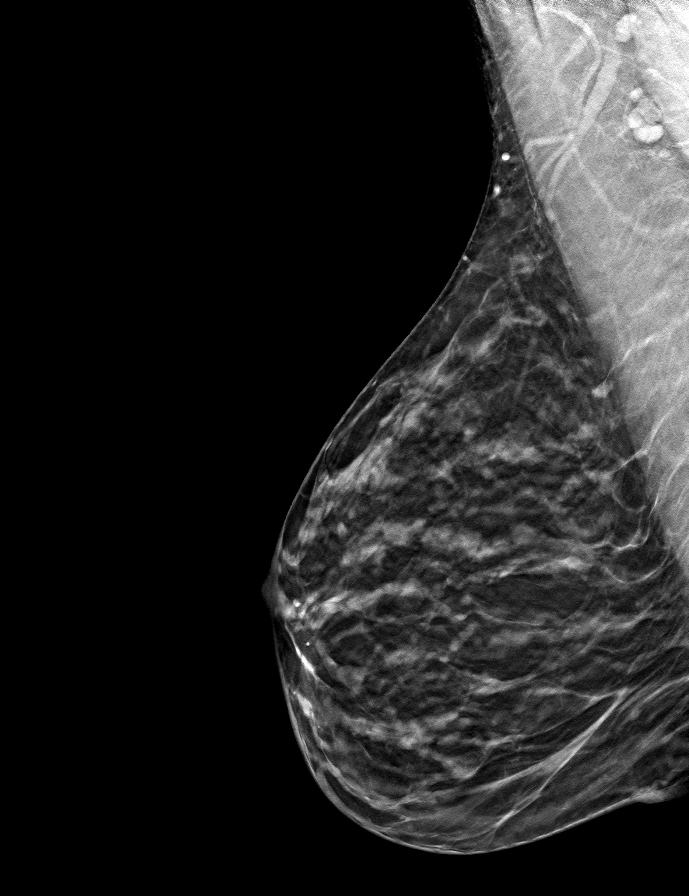
[frame 23/45]
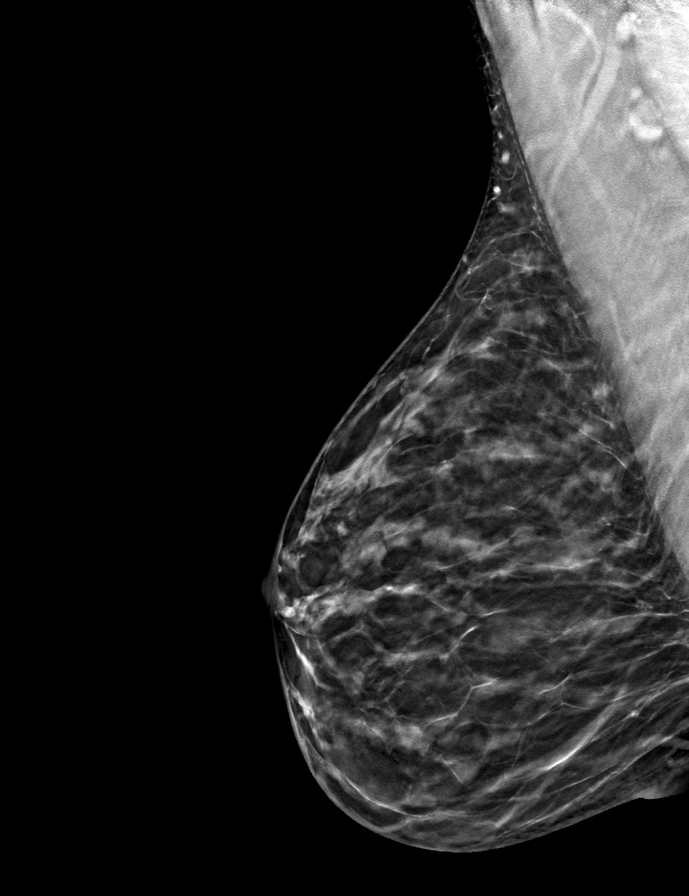

[L CC tomo · tomo slice 20/39.0]
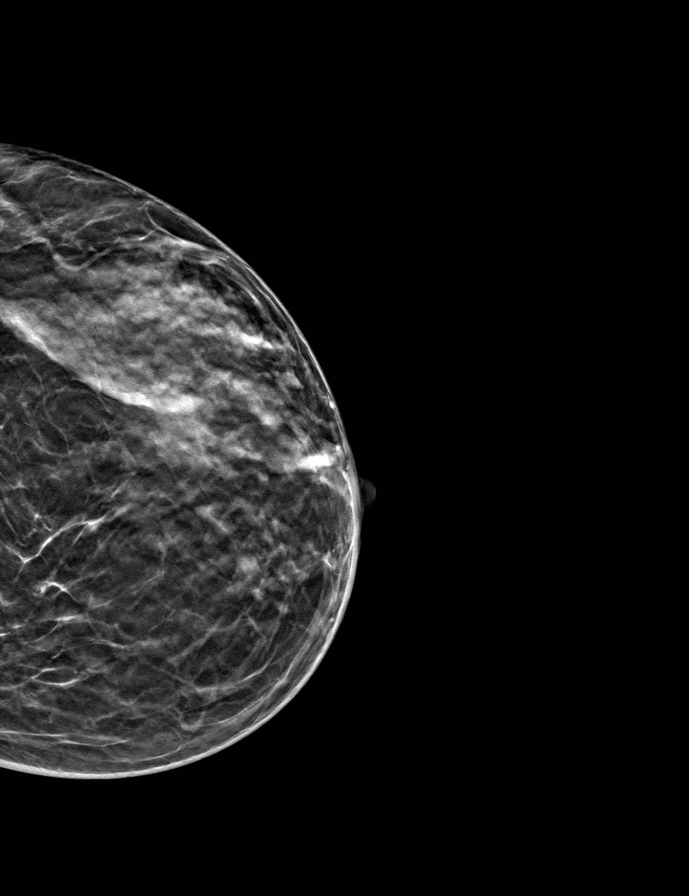

[L MLO tomo · tomo slice 23/46.0]
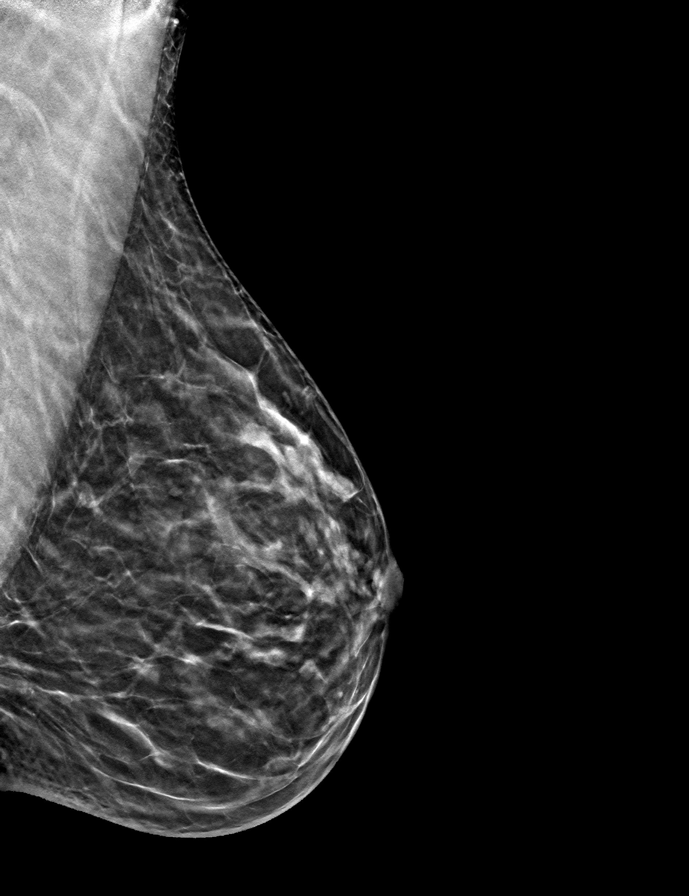

[R CC tomo · tomo slice 21/41.0]
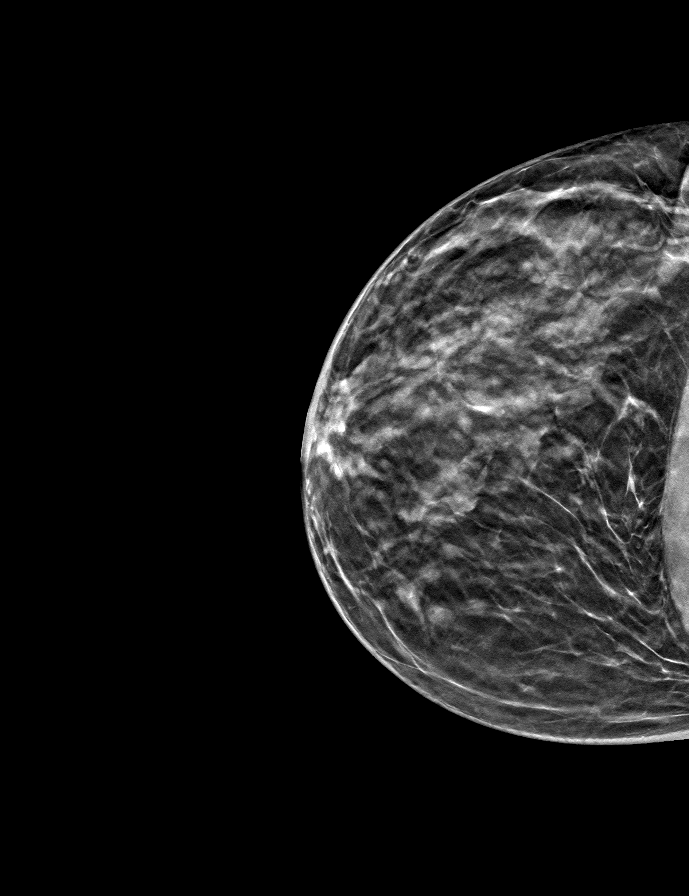

[9 of 24 positions shown; findings below may reference images not displayed]

ACR Breast Density Category b: There are scattered areas of
fibroglandular density.
FINDINGS: There are no findings suspicious for malignancy.
IMPRESSION: No mammographic evidence of malignancy. A result letter of this
screening mammogram will be mailed directly to the patient.

RECOMMENDATION:
Screening mammogram in one year. (Code:[BY])

BI-RADS CATEGORY  1: Negative.

## 2021-08-04 ENCOUNTER — Telehealth: Payer: Self-pay

## 2021-08-04 ENCOUNTER — Other Ambulatory Visit: Payer: Self-pay

## 2021-08-04 DIAGNOSIS — N926 Irregular menstruation, unspecified: Secondary | ICD-10-CM | POA: Insufficient documentation

## 2021-08-04 DIAGNOSIS — R9389 Abnormal findings on diagnostic imaging of other specified body structures: Secondary | ICD-10-CM | POA: Insufficient documentation

## 2021-08-04 DIAGNOSIS — Z1211 Encounter for screening for malignant neoplasm of colon: Secondary | ICD-10-CM

## 2021-08-04 DIAGNOSIS — F319 Bipolar disorder, unspecified: Secondary | ICD-10-CM | POA: Insufficient documentation

## 2021-08-04 DIAGNOSIS — Z800 Family history of malignant neoplasm of digestive organs: Secondary | ICD-10-CM

## 2021-08-04 DIAGNOSIS — F331 Major depressive disorder, recurrent, moderate: Secondary | ICD-10-CM | POA: Insufficient documentation

## 2021-08-04 DIAGNOSIS — N943 Premenstrual tension syndrome: Secondary | ICD-10-CM | POA: Insufficient documentation

## 2021-08-04 DIAGNOSIS — N921 Excessive and frequent menstruation with irregular cycle: Secondary | ICD-10-CM | POA: Insufficient documentation

## 2021-08-04 DIAGNOSIS — N9489 Other specified conditions associated with female genital organs and menstrual cycle: Secondary | ICD-10-CM | POA: Insufficient documentation

## 2021-08-04 DIAGNOSIS — N939 Abnormal uterine and vaginal bleeding, unspecified: Secondary | ICD-10-CM | POA: Insufficient documentation

## 2021-08-04 MED ORDER — NA SULFATE-K SULFATE-MG SULF 17.5-3.13-1.6 GM/177ML PO SOLN: 1.0000 | Freq: Once | ORAL | 0 refills | Status: AC

## 2021-08-04 NOTE — Telephone Encounter (Signed)
Gastroenterology Pre-Procedure Review ? ?Request Date: 11/17/21 ?Requesting Physician: Dr. Allegra Lai ? ?PATIENT REVIEW QUESTIONS: The patient responded to the following health history questions as indicated:   ? ?1. Are you having any GI issues? no ?2. Do you have a personal history of Polyps? no ?3. Do you have a family history of Colon Cancer or Polyps? yes (father colon cancer diagnosed at the age of 70) ?4. Diabetes Mellitus? no ?5. Joint replacements in the past 12 months?no ?6. Major health problems in the past 3 months?no ?7. Any artificial heart valves, MVP, or defibrillator?no ?   ?MEDICATIONS & ALLERGIES:    ?Patient reports the following regarding taking any anticoagulation/antiplatelet therapy:   ?Plavix, Coumadin, Eliquis, Xarelto, Lovenox, Pradaxa, Brilinta, or Effient? no ?Aspirin? no ? ?Patient confirms/reports the following medications:  ?Current Outpatient Medications  ?Medication Sig Dispense Refill  ? buPROPion (WELLBUTRIN SR) 150 MG 12 hr tablet Take 150 mg by mouth 2 (two) times daily.    ? clonazePAM (KLONOPIN) 0.5 MG tablet SMARTSIG:0.5-1 Tablet(s) By Mouth PRN    ? FLUoxetine (PROZAC) 10 MG capsule 1 capsule    ? FLUoxetine (PROZAC) 40 MG capsule Take 40 mg by mouth every morning.    ? ibuprofen (ADVIL) 400 MG tablet Take by mouth.    ? JUNEL FE 1/20 1-20 MG-MCG tablet Take 1 tablet by mouth daily.    ? lamoTRIgine (LAMICTAL) 100 MG tablet 1 tablet    ? Multiple Vitamin (MULTIVITAMIN) capsule Take 1 capsule by mouth daily.    ? Multiple Vitamins-Minerals (WOMENS MULTIVITAMIN) TABS See admin instructions.    ? traZODone (DESYREL) 50 MG tablet Take 50 mg by mouth at bedtime.    ? ?No current facility-administered medications for this visit.  ? ? ?Patient confirms/reports the following allergies:  ?Allergies  ?Allergen Reactions  ? Sulfa Antibiotics Nausea And Vomiting  ? ? ?No orders of the defined types were placed in this encounter. ? ? ?AUTHORIZATION INFORMATION ?Primary Insurance: ?1D#: ?Group  #: ? ?Secondary Insurance: ?1D#: ?Group #: ? ?SCHEDULE INFORMATION: ?Date: 11/17/21 ?Time: ?Location: [facility] ?

## 2021-11-17 ENCOUNTER — Encounter: Payer: Self-pay | Admitting: Gastroenterology

## 2021-11-17 ENCOUNTER — Ambulatory Visit: Payer: BC Managed Care – PPO | Admitting: Gastroenterology

## 2021-11-17 ENCOUNTER — Ambulatory Visit: Payer: BC Managed Care – PPO | Admitting: Anesthesiology

## 2021-11-17 ENCOUNTER — Ambulatory Visit
Admission: RE | Admit: 2021-11-17 | Discharge: 2021-11-17 | Disposition: A | Discharge status: Home / Self Care | Payer: BC Managed Care – PPO | Attending: Gastroenterology | Admitting: Gastroenterology

## 2021-11-17 ENCOUNTER — Encounter
Admission: RE | Disposition: A | Discharge status: Home / Self Care | Payer: Self-pay | Source: Home / Self Care | Attending: Gastroenterology

## 2021-11-17 DIAGNOSIS — D649 Anemia, unspecified: Secondary | ICD-10-CM | POA: Diagnosis not present

## 2021-11-17 DIAGNOSIS — Z1211 Encounter for screening for malignant neoplasm of colon: Secondary | ICD-10-CM | POA: Diagnosis present

## 2021-11-17 DIAGNOSIS — Z800 Family history of malignant neoplasm of digestive organs: Secondary | ICD-10-CM

## 2021-11-17 DIAGNOSIS — F319 Bipolar disorder, unspecified: Secondary | ICD-10-CM | POA: Diagnosis not present

## 2021-11-17 DIAGNOSIS — F419 Anxiety disorder, unspecified: Secondary | ICD-10-CM | POA: Diagnosis not present

## 2021-11-17 HISTORY — PX: COLONOSCOPY WITH PROPOFOL: SHX5780

## 2021-11-17 LAB — CBC
HCT: 36 % (ref 36.0–46.0)
Hemoglobin: 11.7 g/dL — ABNORMAL LOW (ref 12.0–15.0)
MCH: 29.5 pg (ref 26.0–34.0)
MCHC: 32.5 g/dL (ref 30.0–36.0)
MCV: 90.9 fL (ref 80.0–100.0)
Platelets: 222 10*3/uL (ref 150–400)
RBC: 3.96 MIL/uL (ref 3.87–5.11)
RDW: 13.2 % (ref 11.5–15.5)
WBC: 9.1 10*3/uL (ref 4.0–10.5)
nRBC: 0 % (ref 0.0–0.2)

## 2021-11-17 LAB — VITAMIN B12: Vitamin B-12: 390 pg/mL (ref 180–914)

## 2021-11-17 LAB — IRON AND TIBC
Iron: 100 ug/dL (ref 28–170)
Saturation Ratios: 25 % (ref 10.4–31.8)
TIBC: 409 ug/dL (ref 250–450)
UIBC: 309 ug/dL

## 2021-11-17 LAB — POCT PREGNANCY, URINE: Preg Test, Ur: NEGATIVE

## 2021-11-17 LAB — FOLATE: Folate: 31 ng/mL (ref >5.9)

## 2021-11-17 SURGERY — COLONOSCOPY WITH PROPOFOL
Anesthesia: General

## 2021-11-17 MED ORDER — DEXMEDETOMIDINE HCL IN NACL 200 MCG/50ML IV SOLN
INTRAVENOUS | Status: DC | PRN
  Administered 2021-11-17: 8 ug via INTRAVENOUS
  Administered 2021-11-17: 12 ug via INTRAVENOUS

## 2021-11-17 MED ORDER — PROPOFOL 1000 MG/100ML IV EMUL
INTRAVENOUS | Status: AC
  Filled 2021-11-17: qty 100

## 2021-11-17 MED ORDER — PROPOFOL 10 MG/ML IV BOLUS
INTRAVENOUS | Status: DC | PRN
  Administered 2021-11-17: 50 mg via INTRAVENOUS

## 2021-11-17 MED ORDER — MIDAZOLAM HCL 2 MG/2ML IJ SOLN
INTRAMUSCULAR | Status: DC | PRN
  Administered 2021-11-17: 2 mg via INTRAVENOUS

## 2021-11-17 MED ORDER — SODIUM CHLORIDE 0.9 % IV SOLN: INTRAVENOUS | Status: DC

## 2021-11-17 MED ORDER — PROPOFOL 500 MG/50ML IV EMUL
INTRAVENOUS | Status: DC | PRN
  Administered 2021-11-17: 75 ug/kg/min via INTRAVENOUS

## 2021-11-17 MED ORDER — MIDAZOLAM HCL 2 MG/2ML IJ SOLN
INTRAMUSCULAR | Status: AC
  Filled 2021-11-17: qty 2

## 2021-11-17 MED ORDER — LIDOCAINE HCL (PF) 2 % IJ SOLN
INTRAMUSCULAR | Status: AC
  Filled 2021-11-17: qty 5

## 2021-11-17 MED ORDER — LIDOCAINE HCL (CARDIAC) PF 100 MG/5ML IV SOSY
PREFILLED_SYRINGE | INTRAVENOUS | Status: DC | PRN
  Administered 2021-11-17: 60 mg via INTRAVENOUS

## 2021-11-17 MED ORDER — DEXMEDETOMIDINE HCL IN NACL 80 MCG/20ML IV SOLN
INTRAVENOUS | Status: AC
  Filled 2021-11-17: qty 20

## 2021-11-17 NOTE — Transfer of Care (Signed)
Immediate Anesthesia Transfer of Care Note  Patient: Renee Roach  Procedure(s) Performed: COLONOSCOPY WITH PROPOFOL  Patient Location: PACU  Anesthesia Type:General  Level of Consciousness: sedated  Airway & Oxygen Therapy: Patient Spontanous Breathing and Patient connected to nasal cannula oxygen  Post-op Assessment: Report given to RN and Post -op Vital signs reviewed and stable  Post vital signs: Reviewed and stable  Last Vitals:  Vitals Value Taken Time  BP 96/68 11/17/21 0905  Temp    Pulse 73 11/17/21 0905  Resp 19 11/17/21 0905  SpO2 100 % 11/17/21 0905    Last Pain:  Vitals:   11/17/21 0905  TempSrc:   PainSc: Asleep         Complications: No notable events documented.

## 2021-11-17 NOTE — Op Note (Signed)
[facility] Gastroenterology Patient Name: Renee Roach Procedure Date: 11/17/2021 8:40 AM MRN: [id number] Account #: [id number] Date of Birth: 1980/05/22 Admit Type: Outpatient Age: 41 Room: [facility] ENDO ROOM 4 Gender: Female Note Status: Finalized Instrument Name: Prentice Docker [id number] Procedure:             Colonoscopy Indications:           Screening in patient at increased risk: Colorectal                         cancer in father before age 71, This is the patient's                         first colonoscopy Providers:             Toney Reil MD, MD Referring MD:          Lisbeth Ply. Wynelle Link, MD (Referring MD) Medicines:             General Anesthesia Complications:         No immediate complications. Estimated blood loss: None. Procedure:             Pre-Anesthesia Assessment:                        - Prior to the procedure, a History and Physical was                         performed, and patient medications and allergies were                         reviewed. The patient is competent. The risks and                         benefits of the procedure and the sedation options and                         risks were discussed with the patient. All questions                         were answered and informed consent was obtained.                         Patient identification and proposed procedure were                         verified by the physician, the nurse, the                         anesthesiologist, the anesthetist and the technician                         in the pre-procedure area in the procedure room in the                         endoscopy suite. Mental Status Examination: alert and                         oriented. Airway Examination: normal oropharyngeal  airway and neck mobility. Respiratory Examination:                         clear to auscultation. CV Examination: normal.                         Prophylactic  Antibiotics: The patient does not require                         prophylactic antibiotics. Prior Anticoagulants: The                         patient has taken no previous anticoagulant or                         antiplatelet agents. ASA Grade Assessment: II - A                         patient with mild systemic disease. After reviewing                         the risks and benefits, the patient was deemed in                         satisfactory condition to undergo the procedure. The                         anesthesia plan was to use general anesthesia.                         Immediately prior to administration of medications,                         the patient was re-assessed for adequacy to receive                         sedatives. The heart rate, respiratory rate, oxygen                         saturations, blood pressure, adequacy of pulmonary                         ventilation, and response to care were monitored                         throughout the procedure. The physical status of the                         patient was re-assessed after the procedure.                        After obtaining informed consent, the colonoscope was                         passed under direct vision. Throughout the procedure,                         the patient's blood pressure, pulse, and oxygen  saturations were monitored continuously. The                         Colonoscope was introduced through the anus and                         advanced to the the terminal ileum. The colonoscopy                         was performed without difficulty. The patient                         tolerated the procedure well. The quality of the bowel                         preparation was evaluated using the BBPS [facility] Bowel                         Preparation Scale) with scores of: Right Colon = 3,                         Transverse Colon = 3 and Left Colon = 3 (entire mucosa                          seen well with no residual staining, small fragments                         of stool or opaque liquid). The total BBPS score                         equals 9. Findings:      The perianal and digital rectal examinations were normal. Pertinent       negatives include normal sphincter tone and no palpable rectal lesions.      The terminal ileum appeared normal.      The entire examined colon appeared normal. Impression:            - The examined portion of the ileum was normal.                        - The entire examined colon is normal.                        - No specimens collected. Recommendation:        - Discharge patient to home (with escort).                        - Resume previous diet today.                        - Continue present medications.                        - Repeat colonoscopy in 5 years for surveillance. Procedure Code(s):     --- Professional ---                        H6314, Colorectal cancer screening; colonoscopy on  individual at high risk Diagnosis Code(s):     --- Professional ---                        Z80.0, Family history of malignant neoplasm of                         digestive organs CPT copyright 2019 American Medical Association. All rights reserved. The codes documented in this report are preliminary and upon coder review may  be revised to meet current compliance requirements. Dr. Libby Maw Toney Reil MD, MD 11/17/2021 9:07:47 AM This report has been signed electronically. Number of Addenda: 0 Note Initiated On: 11/17/2021 8:40 AM Scope Withdrawal Time: 0 hours 6 minutes 50 seconds  Total Procedure Duration: 0 hours 12 minutes 53 seconds  Estimated Blood Loss:  Estimated blood loss: none.      [facility]

## 2021-11-17 NOTE — H&P (Signed)
Renee Darby, MD [address]  [place]  [place], [place] [address]  Main: [phone]  Fax: [phone] Pager: [phone]  Primary Care Physician:  Donald Prose, MD Primary Gastroenterologist:  Dr. Cephas Roach  Pre-Procedure History & Physical: HPI:  Renee Roach is a 41 y.o. female is here for an colonoscopy.   Past Medical History:  Diagnosis Date   Alcohol abuse    Anxiety    Bipolar 1 disorder ([place])    Depression     Past Surgical History:  Procedure Laterality Date   ADENOIDECTOMY     DILATATION & CURETTAGE/HYSTEROSCOPY WITH MYOSURE N/A 10/31/2018   Procedure: DILATATION & CURETTAGE/HYSTEROSCOPY WITH MYOSURE;  Surgeon: Thurnell Lose, MD;  Location: [facility];  Service: Gynecology;  Laterality: N/A;    Prior to Admission medications   Medication Sig Start Date End Date Taking? Authorizing Provider  buPROPion (WELLBUTRIN SR) 150 MG 12 hr tablet Take 150 mg by mouth 2 (two) times daily.   Yes Joline Salt, RN  FLUoxetine (PROZAC) 10 MG capsule 1 capsule   Yes [provider]  lamoTRIgine (LAMICTAL) 100 MG tablet 1 tablet   Yes [provider]  propranolol (INDERAL) 40 MG tablet Take 40 mg by mouth 3 (three) times daily.   Yes [provider]  traZODone (DESYREL) 50 MG tablet Take 50 mg by mouth at bedtime.   Yes Joline Salt, RN  clonazePAM Bobbye [place]) 0.5 MG tablet SMARTSIG:0.5-1 Tablet(s) By Mouth PRN Patient not taking: Reported on 11/17/2021 07/19/21   [provider]  FLUoxetine (PROZAC) 40 MG capsule Take 40 mg by mouth every morning. 06/04/21   [provider]  ibuprofen (ADVIL) 400 MG tablet Take by mouth.    [provider]  JUNEL FE 1/20 1-20 MG-MCG tablet Take 1 tablet by mouth daily. 07/23/21   [provider]  Multiple Vitamin (MULTIVITAMIN) capsule Take 1 capsule by mouth daily.    Joline Salt, RN  Multiple Vitamins-Minerals (WOMENS MULTIVITAMIN) TABS See  admin instructions.    [provider]    Allergies as of 08/04/2021 - Review Complete 08/04/2021  Allergen Reaction Noted   Sulfa antibiotics Nausea And Vomiting 09/12/2018    Family History  Problem Relation Age of Onset   Breast cancer Neg Hx    BRCA 1/2 Neg Hx     Social History   Socioeconomic History   Marital status: Single    Spouse name: Not on file   Number of children: Not on file   Years of education: Not on file   Highest education level: Not on file  Occupational History   Not on file  Tobacco Use   Smoking status: Never   Smokeless tobacco: Never  Vaping Use   Vaping Use: Never used  Substance and Sexual Activity   Alcohol use: Not Currently    Comment: in recovery since 2015   Drug use: Yes    Types: Marijuana    Comment: occas   Sexual activity: Not on file  Other Topics Concern   Not on file  Social History Narrative   Not on file   Social Determinants of Health   Financial Resource Strain: Not on file  Food Insecurity: Not on file  Transportation Needs: Not on file  Physical Activity: Not on file  Stress: Not on file  Social Connections: Not on file  Intimate Partner Violence: Not on file    Review of Systems: See HPI, otherwise negative ROS  Physical Exam: BP 113/75   Pulse 66   Temp 98.2 F (36.8 C) (Temporal)   Resp 16   Ht $R'5\' 1"'XN$  (1.549 m)   Wt 49 kg   SpO2 100%   BMI 20.41 kg/m  General:   Alert,  pleasant and cooperative in NAD Head:  Normocephalic and atraumatic. Neck:  Supple; no masses or thyromegaly. Lungs:  Clear throughout to auscultation.    Heart:  Regular rate and rhythm. Abdomen:  Soft, nontender and nondistended. Normal bowel sounds, without guarding, and without rebound.   Neurologic:  Alert and  oriented x4;  grossly normal neurologically.  Impression/Plan: Renee Roach is here for an colonoscopy to be performed for colon cancer screening, father with colon cancer at age 65  Risks,  benefits, limitations, and alternatives regarding  colonoscopy have been reviewed with the patient.  Questions have been answered.  All parties agreeable.   Sherri Sear, MD  11/17/2021, 8:36 AM

## 2021-11-17 NOTE — Anesthesia Postprocedure Evaluation (Signed)
Anesthesia Post Note  Patient: Renee Roach  Procedure(s) Performed: COLONOSCOPY WITH PROPOFOL  Patient location during evaluation: PACU Anesthesia Type: General Level of consciousness: awake and awake and alert Pain management: pain level controlled Vital Signs Assessment: post-procedure vital signs reviewed and stable Respiratory status: spontaneous breathing and respiratory function stable Cardiovascular status: stable Anesthetic complications: no   No notable events documented.   Last Vitals:  Vitals:   11/17/21 0925 11/17/21 0935  BP: 106/82 99/72  Pulse: 75 66  Resp: (!) 21 15  Temp:    SpO2: 100% 100%    Last Pain:  Vitals:   11/17/21 0935  TempSrc:   PainSc: 0-No pain                 VAN STAVEREN,Keshawn Sundberg

## 2021-11-17 NOTE — Anesthesia Preprocedure Evaluation (Signed)
Anesthesia Evaluation  Patient identified by MRN, date of birth, ID band Patient awake    Reviewed: Allergy & Precautions, NPO status , Patient's Chart, lab work & pertinent test results  Airway Mallampati: II  TM Distance: >3 FB Neck ROM: full    Dental  (+) Teeth Intact   Pulmonary neg pulmonary ROS,    Pulmonary exam normal breath sounds clear to auscultation       Cardiovascular Exercise Tolerance: Good negative cardio ROS Normal cardiovascular exam Rhythm:Regular Rate:Normal     Neuro/Psych Anxiety Depression Bipolar Disorder negative neurological ROS  negative psych ROS   GI/Hepatic negative GI ROS, Neg liver ROS,   Endo/Other  negative endocrine ROS  Renal/GU negative Renal ROS  negative genitourinary   Musculoskeletal   Abdominal Normal abdominal exam  (+)   Peds negative pediatric ROS (+)  Hematology negative hematology ROS (+)   Anesthesia Other Findings Past Medical History: No date: Alcohol abuse No date: Anxiety No date: Bipolar 1 disorder ([facility]) No date: Depression  Past Surgical History: No date: ADENOIDECTOMY 10/31/2018: DILATATION & CURETTAGE/HYSTEROSCOPY WITH MYOSURE; N/A     Comment:  Procedure: DILATATION & CURETTAGE/HYSTEROSCOPY WITH               MYOSURE;  Surgeon: Geryl Rankins, MD;  Location: [facility]               [place];  Service: Gynecology;  Laterality:               N/A;     Reproductive/Obstetrics negative OB ROS                             Anesthesia Physical Anesthesia Plan  ASA: 2  Anesthesia Plan: General   Post-op Pain Management:    Induction: Intravenous  PONV Risk Score and Plan: Propofol infusion and TIVA  Airway Management Planned: Natural Airway  Additional Equipment:   Intra-op Plan:   Post-operative Plan:   Informed Consent: I have reviewed the patients History and Physical, chart, labs and discussed the  procedure including the risks, benefits and alternatives for the proposed anesthesia with the patient or authorized representative who has indicated his/her understanding and acceptance.     Dental Advisory Given  Plan Discussed with: CRNA and Surgeon  Anesthesia Plan Comments:         Anesthesia Quick Evaluation

## 2021-11-18 ENCOUNTER — Encounter: Payer: Self-pay | Admitting: Gastroenterology
